# Patient Record
Sex: Female | Born: 1935 | Race: Black or African American | Hispanic: No | Smoking: Never smoker
Health system: Southern US, Community
[De-identification: ages and names within clinical notes are randomized; demographics above are authoritative.]

## PROBLEM LIST (undated history)

## (undated) DIAGNOSIS — G40909 Epilepsy, unspecified, not intractable, without status epilepticus: Secondary | ICD-10-CM

## (undated) DIAGNOSIS — E785 Hyperlipidemia, unspecified: Secondary | ICD-10-CM

## (undated) DIAGNOSIS — I10 Essential (primary) hypertension: Secondary | ICD-10-CM

## (undated) DIAGNOSIS — I639 Cerebral infarction, unspecified: Secondary | ICD-10-CM

## (undated) DIAGNOSIS — C50919 Malignant neoplasm of unspecified site of unspecified female breast: Secondary | ICD-10-CM

## (undated) HISTORY — PX: OTHER SURGICAL HISTORY: SHX169

---

## 2004-04-30 ENCOUNTER — Ambulatory Visit: Payer: Self-pay | Admitting: Internal Medicine

## 2004-05-15 ENCOUNTER — Ambulatory Visit: Payer: Self-pay | Admitting: Internal Medicine

## 2004-11-13 ENCOUNTER — Ambulatory Visit: Payer: Self-pay | Admitting: Internal Medicine

## 2005-05-09 ENCOUNTER — Ambulatory Visit: Payer: Self-pay | Admitting: Internal Medicine

## 2005-11-20 ENCOUNTER — Ambulatory Visit: Payer: Self-pay | Admitting: Internal Medicine

## 2005-12-26 ENCOUNTER — Ambulatory Visit: Payer: Self-pay | Admitting: Internal Medicine

## 2006-06-24 ENCOUNTER — Ambulatory Visit: Payer: Self-pay | Admitting: Internal Medicine

## 2006-07-01 ENCOUNTER — Ambulatory Visit: Payer: Self-pay | Admitting: Internal Medicine

## 2006-07-14 ENCOUNTER — Ambulatory Visit: Payer: Self-pay | Admitting: Internal Medicine

## 2006-11-12 ENCOUNTER — Ambulatory Visit: Payer: Self-pay | Admitting: Internal Medicine

## 2006-12-22 ENCOUNTER — Ambulatory Visit: Payer: Self-pay | Admitting: Internal Medicine

## 2006-12-23 ENCOUNTER — Ambulatory Visit: Payer: Self-pay | Admitting: Internal Medicine

## 2007-01-13 ENCOUNTER — Ambulatory Visit: Payer: Self-pay | Admitting: Internal Medicine

## 2007-01-25 ENCOUNTER — Ambulatory Visit: Payer: Self-pay | Admitting: Internal Medicine

## 2007-05-16 ENCOUNTER — Ambulatory Visit: Payer: Self-pay | Admitting: Internal Medicine

## 2007-06-09 ENCOUNTER — Ambulatory Visit: Payer: Self-pay | Admitting: Internal Medicine

## 2007-06-13 ENCOUNTER — Ambulatory Visit: Payer: Self-pay | Admitting: Internal Medicine

## 2007-06-30 ENCOUNTER — Ambulatory Visit: Payer: Self-pay | Admitting: Internal Medicine

## 2007-07-14 ENCOUNTER — Ambulatory Visit: Payer: Self-pay | Admitting: Internal Medicine

## 2007-08-13 ENCOUNTER — Ambulatory Visit: Payer: Self-pay | Admitting: Internal Medicine

## 2007-09-13 ENCOUNTER — Ambulatory Visit: Payer: Self-pay | Admitting: Internal Medicine

## 2007-11-13 ENCOUNTER — Ambulatory Visit: Payer: Self-pay | Admitting: Internal Medicine

## 2007-11-24 ENCOUNTER — Ambulatory Visit: Payer: Self-pay | Admitting: Internal Medicine

## 2007-12-14 ENCOUNTER — Ambulatory Visit: Payer: Self-pay | Admitting: Internal Medicine

## 2008-01-13 ENCOUNTER — Ambulatory Visit: Payer: Self-pay | Admitting: Internal Medicine

## 2008-02-13 ENCOUNTER — Ambulatory Visit: Payer: Self-pay | Admitting: Internal Medicine

## 2008-05-15 ENCOUNTER — Ambulatory Visit: Payer: Self-pay | Admitting: Internal Medicine

## 2008-06-12 ENCOUNTER — Ambulatory Visit: Payer: Self-pay | Admitting: Internal Medicine

## 2008-08-15 ENCOUNTER — Ambulatory Visit: Payer: Self-pay | Admitting: Internal Medicine

## 2008-08-31 ENCOUNTER — Ambulatory Visit: Payer: Self-pay | Admitting: Surgery

## 2008-09-12 ENCOUNTER — Ambulatory Visit: Payer: Self-pay | Admitting: Internal Medicine

## 2008-10-11 ENCOUNTER — Ambulatory Visit: Payer: Self-pay | Admitting: Internal Medicine

## 2008-10-12 ENCOUNTER — Ambulatory Visit: Payer: Self-pay | Admitting: Internal Medicine

## 2008-11-12 ENCOUNTER — Ambulatory Visit: Payer: Self-pay | Admitting: Internal Medicine

## 2008-11-15 ENCOUNTER — Ambulatory Visit: Payer: Self-pay | Admitting: Internal Medicine

## 2008-12-13 ENCOUNTER — Ambulatory Visit: Payer: Self-pay | Admitting: Internal Medicine

## 2009-01-12 ENCOUNTER — Ambulatory Visit: Payer: Self-pay | Admitting: Internal Medicine

## 2009-02-07 ENCOUNTER — Ambulatory Visit: Payer: Self-pay | Admitting: Internal Medicine

## 2009-02-12 ENCOUNTER — Ambulatory Visit: Payer: Self-pay | Admitting: Internal Medicine

## 2009-02-20 ENCOUNTER — Ambulatory Visit: Payer: Self-pay | Admitting: Family Medicine

## 2009-03-07 ENCOUNTER — Ambulatory Visit: Payer: Self-pay | Admitting: Family Medicine

## 2009-03-14 ENCOUNTER — Ambulatory Visit: Payer: Self-pay | Admitting: Internal Medicine

## 2009-05-15 ENCOUNTER — Ambulatory Visit: Payer: Self-pay | Admitting: Internal Medicine

## 2009-05-30 ENCOUNTER — Ambulatory Visit: Payer: Self-pay | Admitting: Internal Medicine

## 2009-06-12 ENCOUNTER — Ambulatory Visit: Payer: Self-pay | Admitting: Internal Medicine

## 2009-08-12 ENCOUNTER — Ambulatory Visit: Payer: Self-pay | Admitting: Internal Medicine

## 2009-08-30 ENCOUNTER — Inpatient Hospital Stay: Payer: Self-pay | Admitting: Internal Medicine

## 2009-09-12 ENCOUNTER — Ambulatory Visit: Payer: Self-pay | Admitting: Internal Medicine

## 2010-07-09 ENCOUNTER — Emergency Department: Payer: Self-pay | Admitting: Emergency Medicine

## 2010-08-14 ENCOUNTER — Inpatient Hospital Stay: Payer: Self-pay | Admitting: Internal Medicine

## 2010-09-13 ENCOUNTER — Ambulatory Visit: Payer: Self-pay | Admitting: Internal Medicine

## 2010-09-19 ENCOUNTER — Inpatient Hospital Stay: Payer: Self-pay | Admitting: *Deleted

## 2010-10-13 ENCOUNTER — Ambulatory Visit: Payer: Self-pay | Admitting: Internal Medicine

## 2010-11-06 ENCOUNTER — Inpatient Hospital Stay: Payer: Self-pay | Admitting: Internal Medicine

## 2010-11-12 ENCOUNTER — Ambulatory Visit: Payer: Self-pay

## 2011-06-30 IMAGING — CT CT HEAD WITHOUT CONTRAST
2 series · 15 of 30 positions shown, 19 images · non-contrast
Comparison: none

REASON FOR EXAM: right sided weakness
COMMENTS:   May transport without cardiac monitor

PROCEDURE:     CT  - CT HEAD WITHOUT CONTRAST  - August 30, 2009  [DATE]
RESULT:     Comparison:  None
TECHNIQUE: Multiple axial images from the foramen magnum to the vertex were
obtained without IV contrast.

[Series 2: without · axial · non-contrast · 0.39mm/px · z∈[+310,+430]mm · 13 of 29 slices shown, 17 images]
[im 3/29  brain]
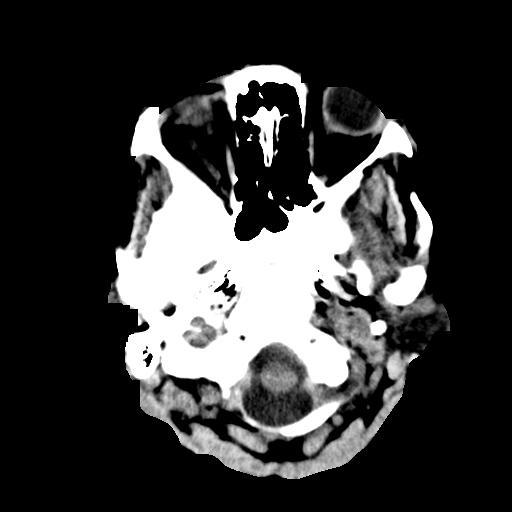
[im 3/29  bone]
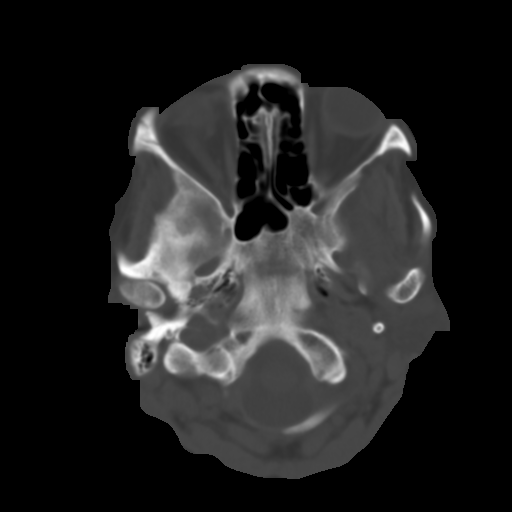
[im 5/29  brain]
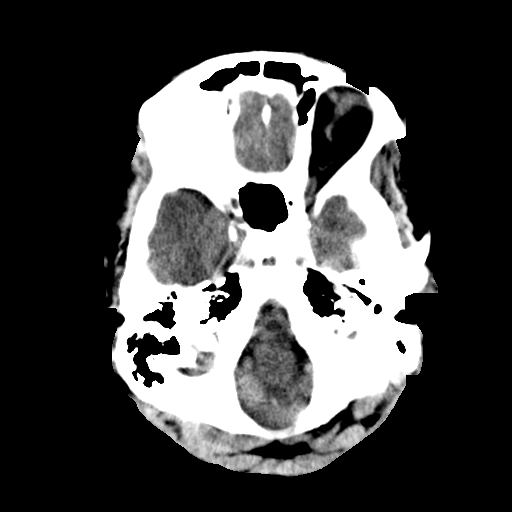
[im 7/29  brain]
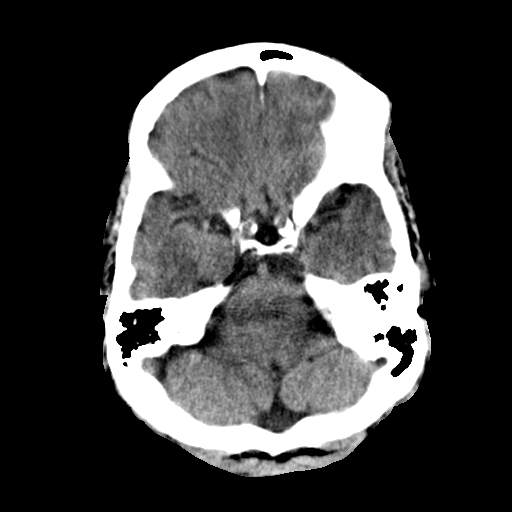
[im 9/29  brain]
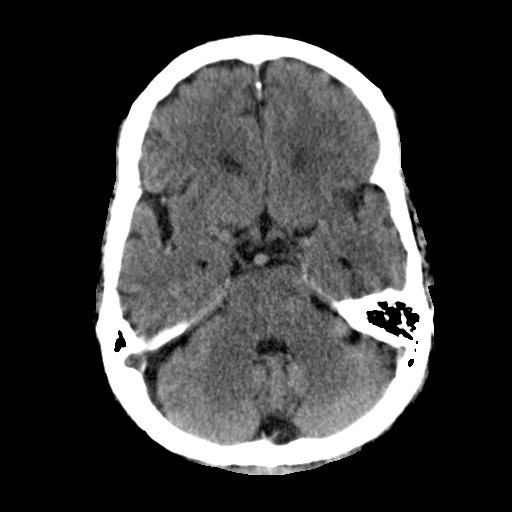
[im 11/29  brain]
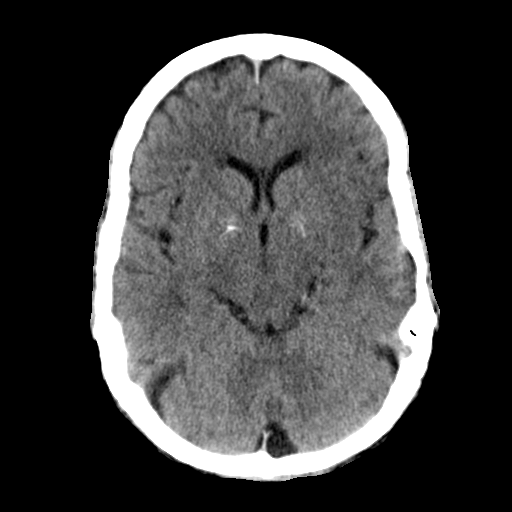
[im 11/29  bone]
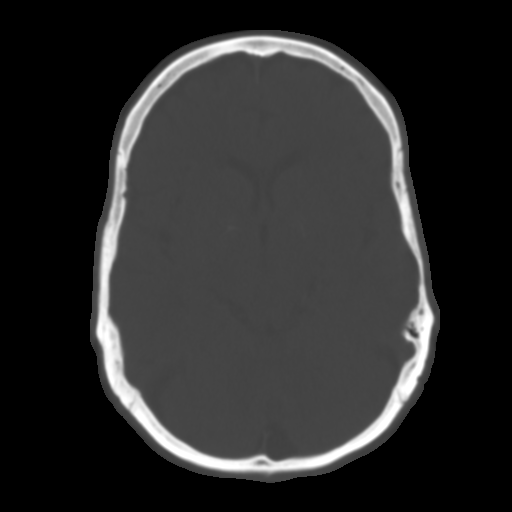
[im 13/29  brain]
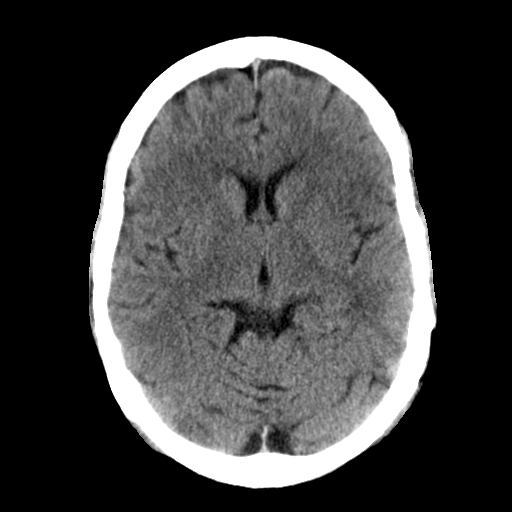
[im 15/29  brain]
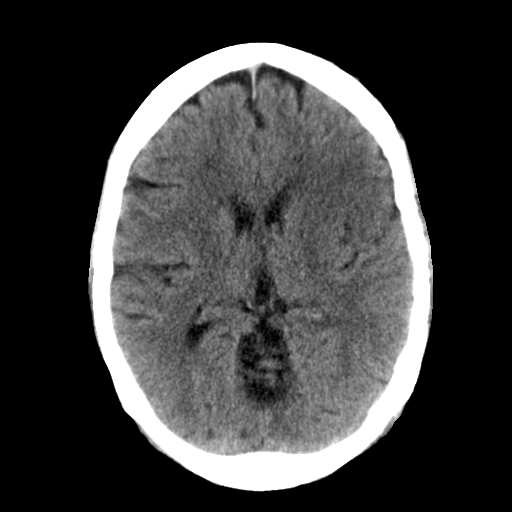
[im 17/29  brain]
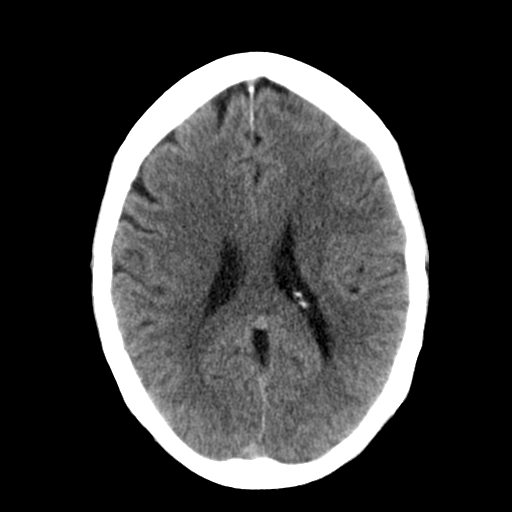
[im 19/29  brain]
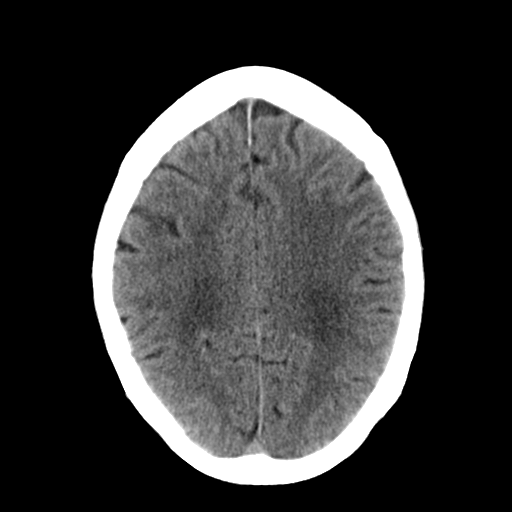
[im 19/29  bone]
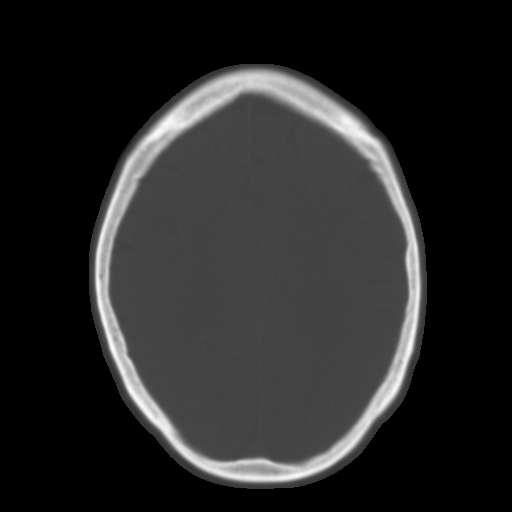
[im 21/29  brain]
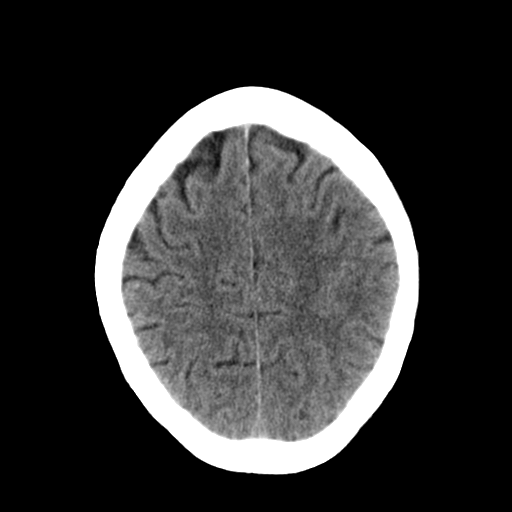
[im 23/29  brain]
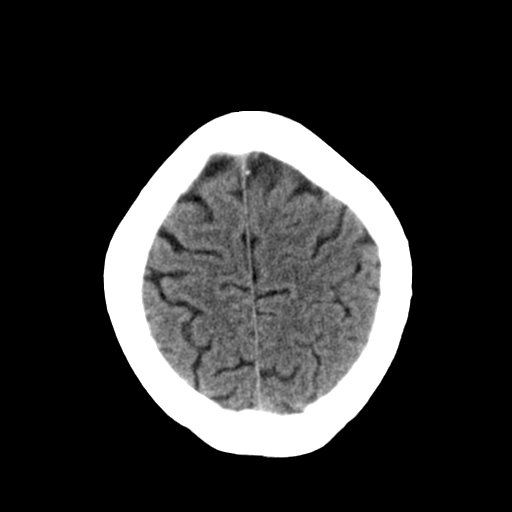
[im 25/29  brain]
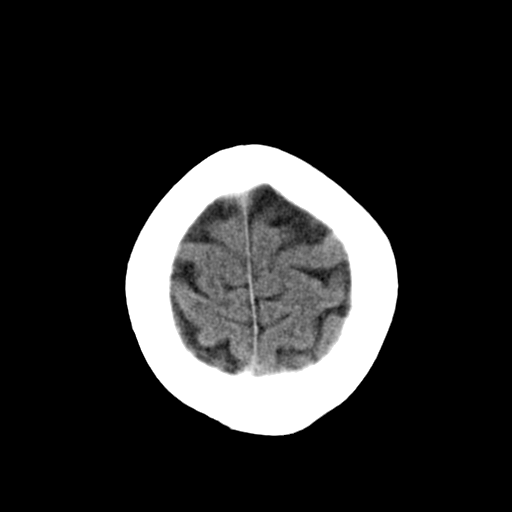
[im 27/29  brain]
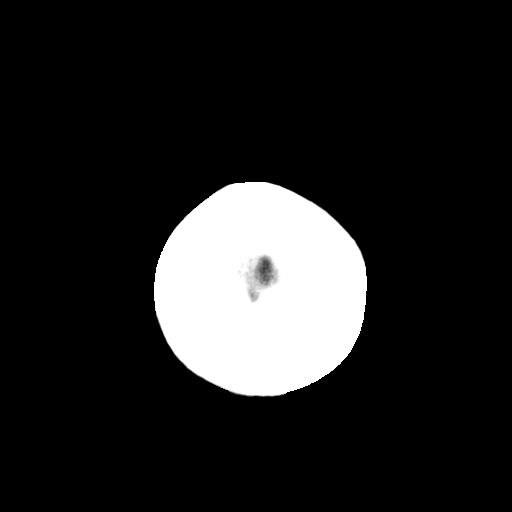
[im 27/29  bone]
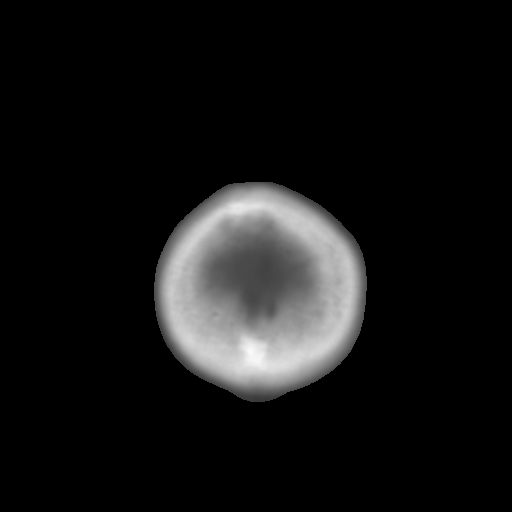

[Series 3: bone · axial · 0.39mm/px · z∈[+310,+330]mm · 2 of 29 slices shown]
[im 3/29  bone]
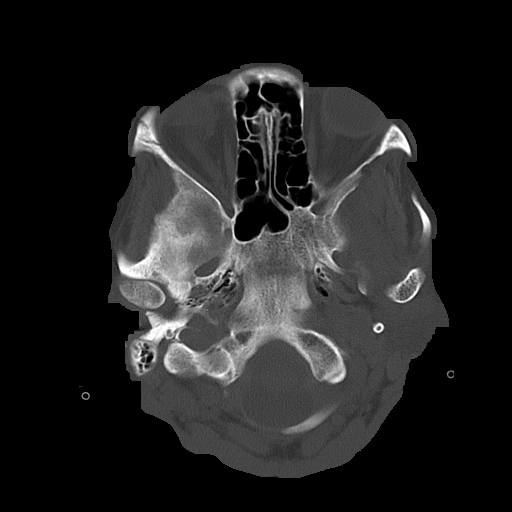
[im 7/29  bone]
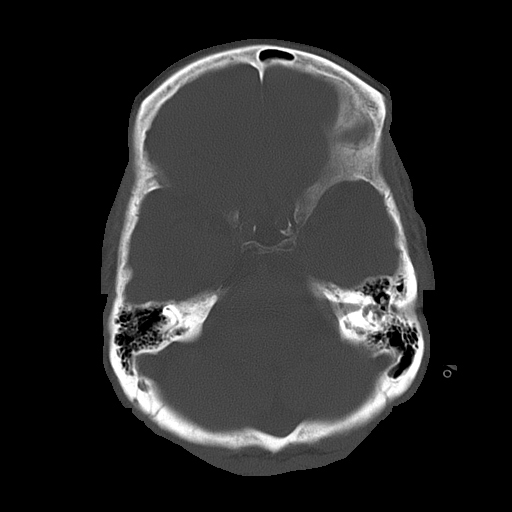

[15 of 30 positions shown; findings below may reference images not displayed]

FINDINGS: There is no evidence of mass effect, midline shift, or extra-axial fluid
collections.  There is no evidence of a space-occupying lesion or
intracranial hemorrhage. There is left frontal lobe low-attenuation with
effacement of the gray-white differentiation concerning for an acute
infarct. There is generalized cerebral atrophy. There is periventricular
white matter low attenuation likely secondary to microangiopathy.

The ventricles and sulci are appropriate for the patient's age. The basal
cisterns are patent.

Visualized portions of the orbits are unremarkable. The visualized portions
of the paranasal sinuses and mastoid air cells are unremarkable.
Cerebrovascular atherosclerotic calcifications are noted.

The osseous structures are unremarkable.
IMPRESSION: There is left frontal lobe low-attenuation with effacement of the gray-white
differentiation concerning for a nonhemorrhagic acute infarct.

## 2011-06-30 IMAGING — US US CAROTID DUPLEX BILAT
1 series · 17 of 24 positions shown · non-contrast
Comparison: none

REASON FOR EXAM: cva
COMMENTS:

[Series 1: us carotid duplex bilat · 17 of 54 slices shown]
[im 1/54]
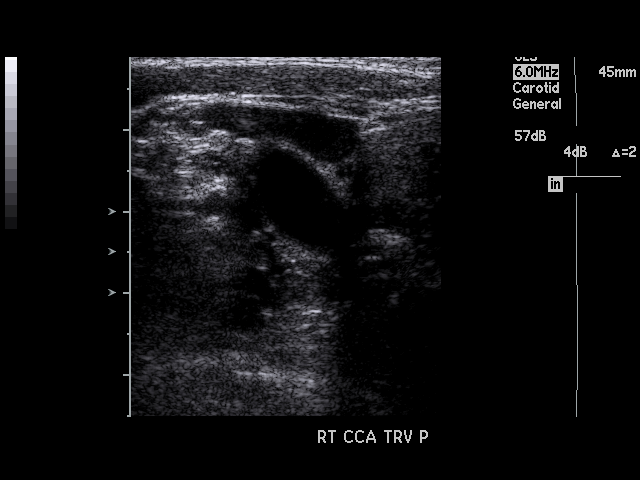
[im 5/54]
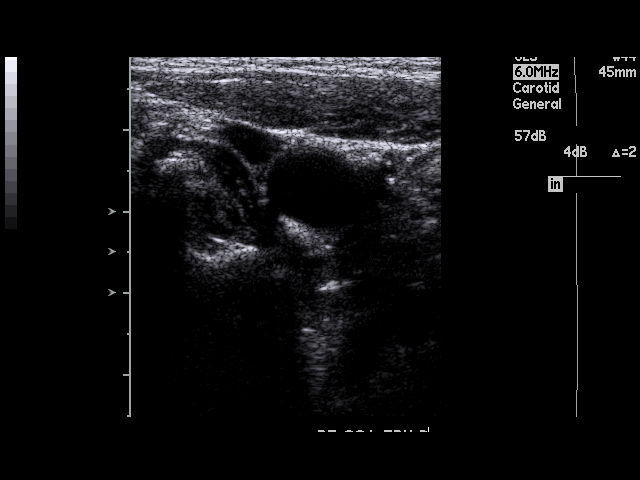
[im 7/54]
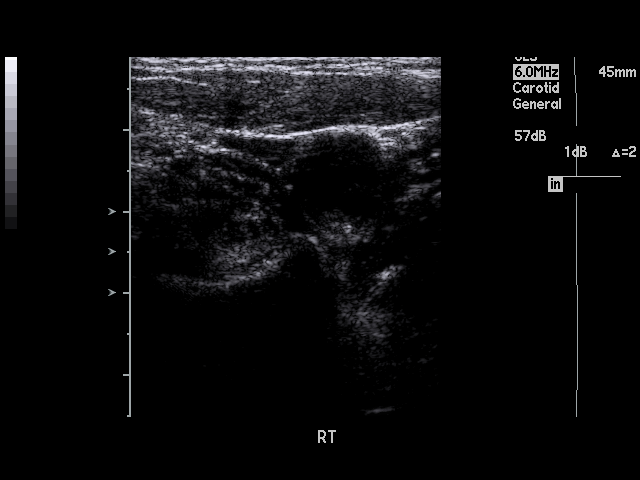
[im 10/54]
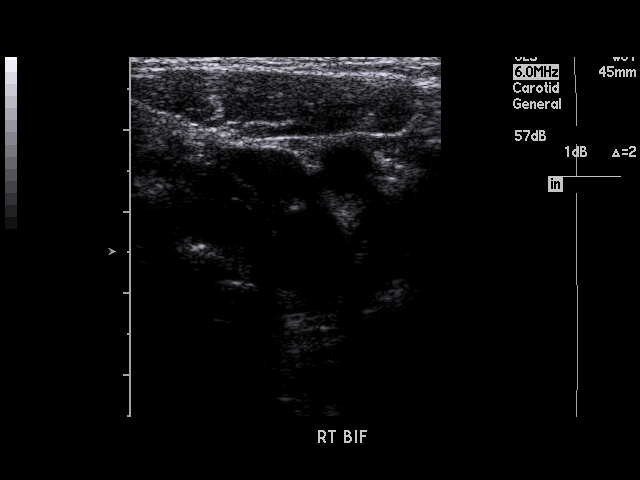
[im 14/54]
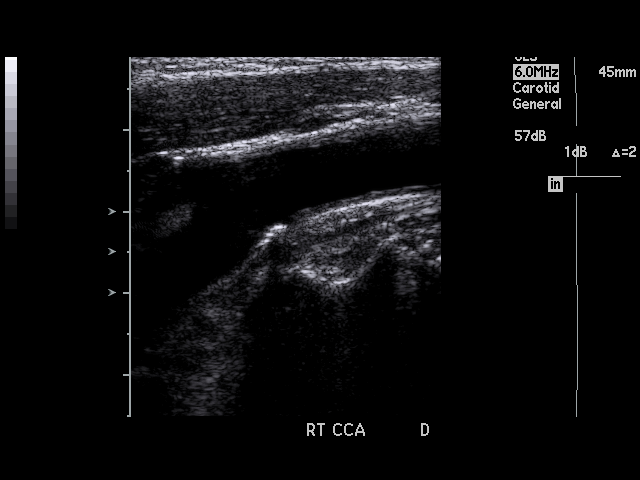
[im 17/54]
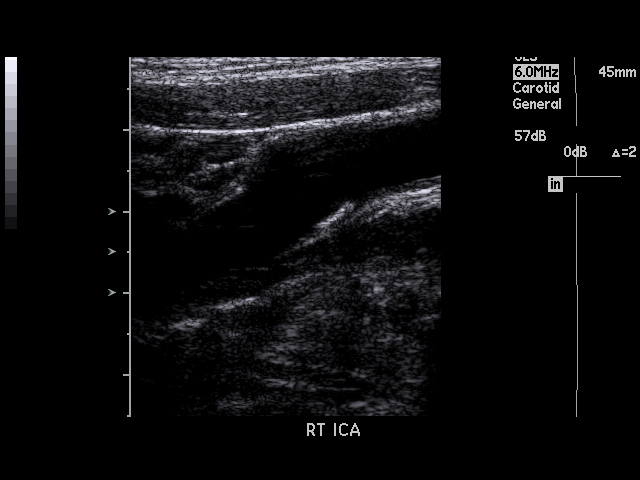
[im 21/54]
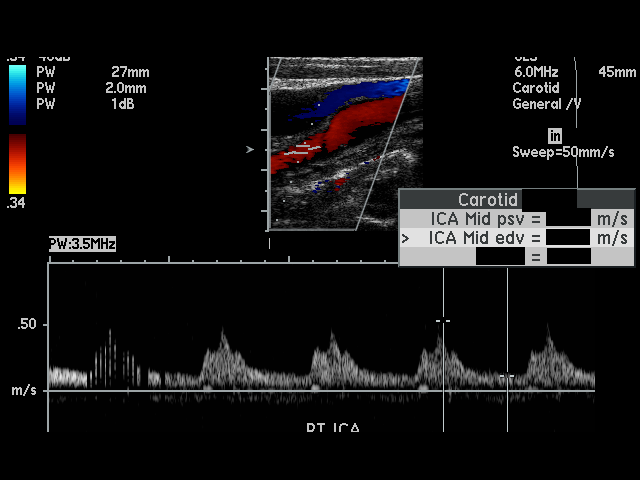
[im 24/54]
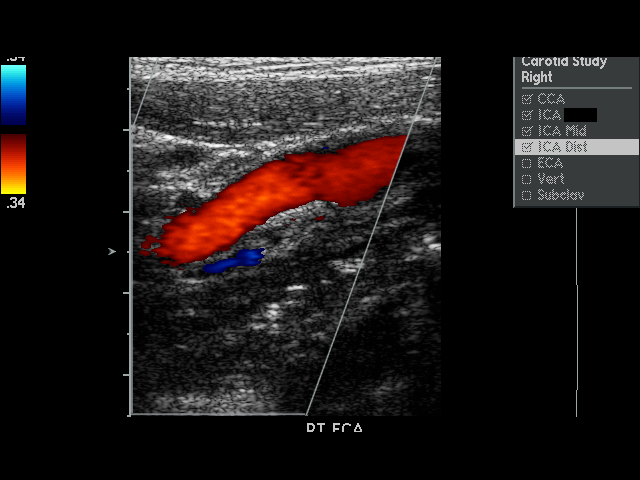
[im 28/54]
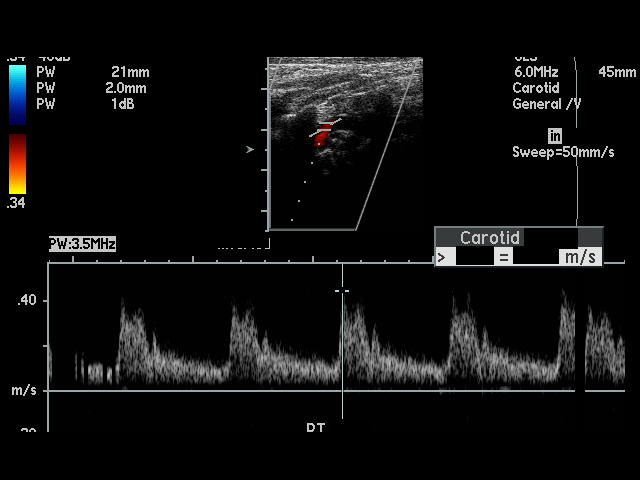
[im 30/54]
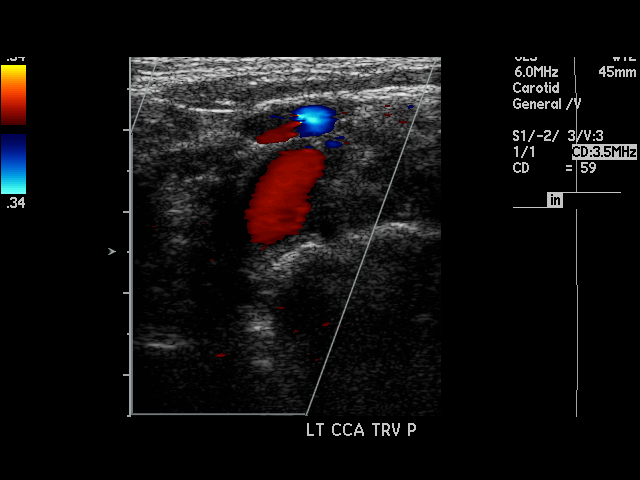
[im 33/54]
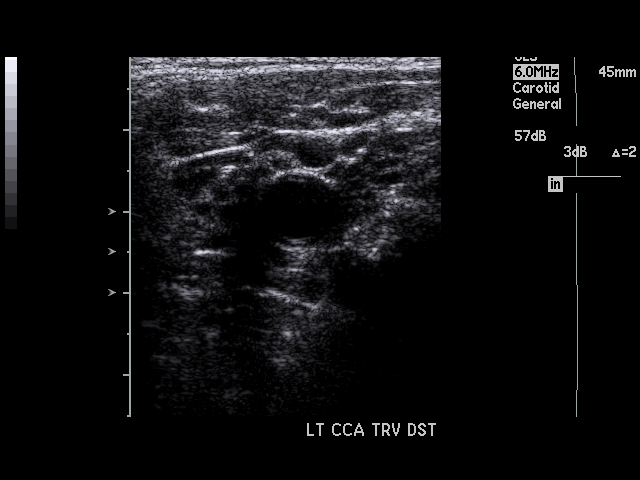
[im 37/54]
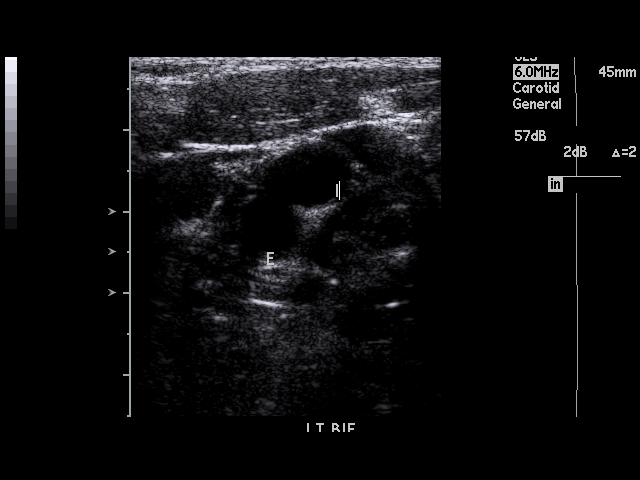
[im 40/54]
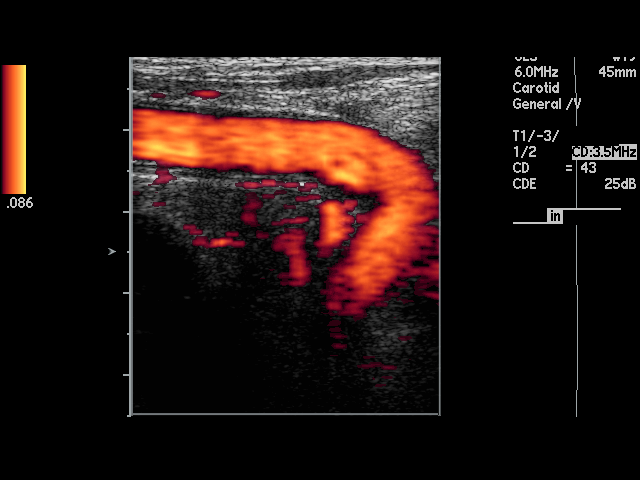
[im 44/54]
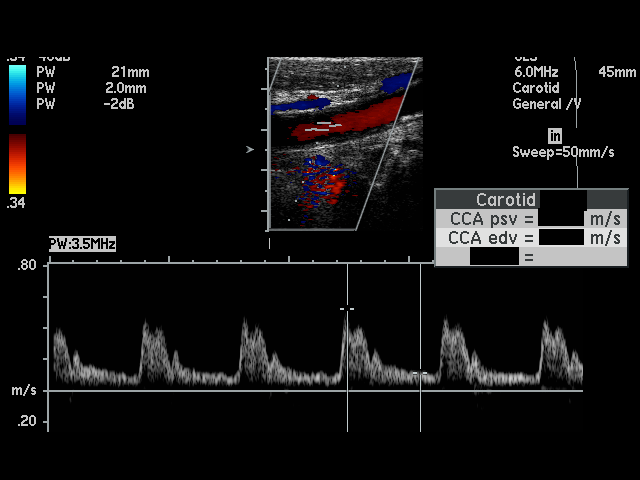
[im 47/54]
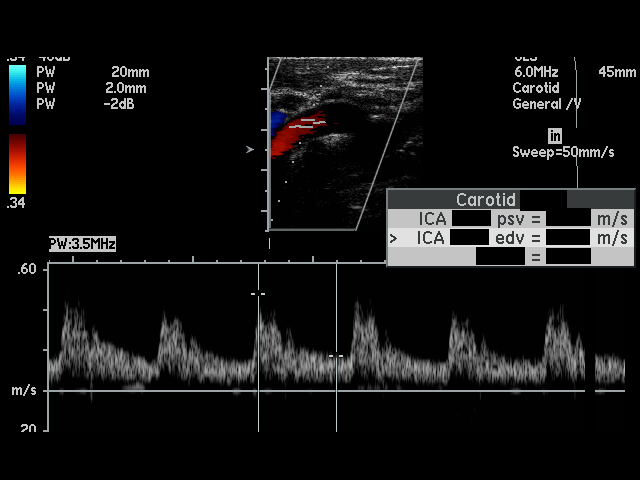
[im 49/54]
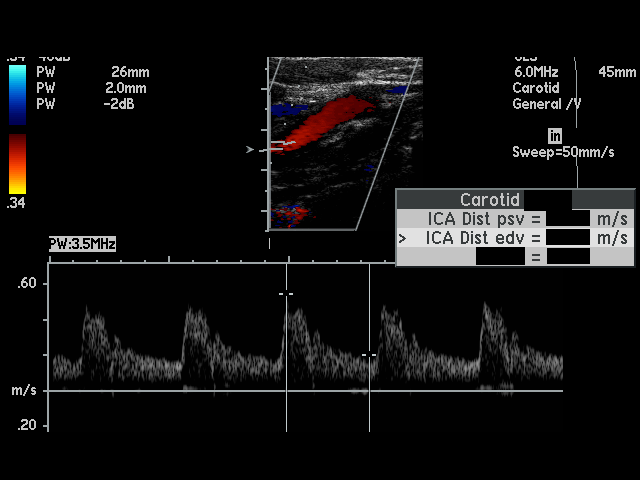
[im 54/54]
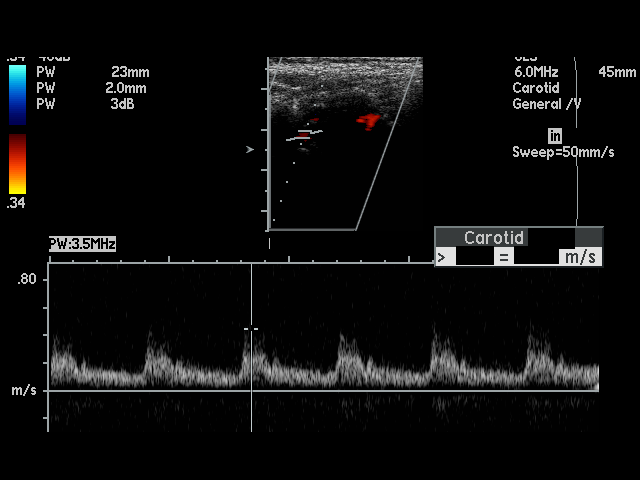

[17 of 24 positions shown; findings below may reference images not displayed]

PROCEDURE:     US  - US CAROTID DOPPLER BILATERAL  - August 30, 2009  [DATE]

RESULT:     There is noted a small amount of calcific plaque formation about
the carotid bifurcations bilaterally. On the right, the peak right common
carotid flow velocity measures 0.527 meters/second and the peak right
internal carotid artery flow velocity measures 0.535 meters/second.  ICA/CCA
ratio is 1.015.

On the left, the peak left common carotid artery flow velocity measures
0.529 meters/second and the left internal carotid artery flow velocity
measures 0.548 meters/second. ICA/CCA ratio is 1.036. These values
bilaterally are within the normal range and are consistent with bilateral
absence of hemodynamically significant stenosis.

There is observed antegrade flow in both vertebrals.
IMPRESSION: 1. There is amount of calcific plaque formation noted about the carotid
bifurcations bilaterally.
2. No hemodynamically significant stenosis is seen on either side.
3. There is antegrade flow in both vertebrals.

## 2012-09-22 ENCOUNTER — Emergency Department: Payer: Self-pay | Admitting: Emergency Medicine

## 2012-10-07 ENCOUNTER — Emergency Department: Payer: Self-pay | Admitting: Emergency Medicine

## 2014-08-04 NOTE — Consult Note (Signed)
PATIENT NAME:  Kirsten Beck, Kirsten Beck MR#:  [id number] DATE OF BIRTH:  27-Oct-1928  DATE OF CONSULTATION:  09/22/2012  CONSULTING PHYSICIAN:  Huey Romans, MD  REASON FOR CONSULTATION: Acute angioedema.   HISTORY OF PRESENT ILLNESS: The patient is an 81 year old black female who has had intermittent tongue swelling over the last couple of weeks. She has been on multiple medications for blood pressure, including lisinopril. This evening, she had marked swelling of her tongue.  It swelled so large she is unable to talk.  She is not acutely short of breath.  She came to the Emergency Room about an hour ago. She had an IV started, given IV medications, and she actually feels like her tongue swelling is going down and feels a little bit better right now. The intermittent swelling she has had was never severe, and it went away on its own. She does not have a whole lot of other allergy problems.   PAST MEDICAL HISTORY: Significant for blood pressure. She is followed by Dr. Shade Flood for her medical problems.  She has coronary artery disease and type 2 diabetes as well. She has had a previous CVA, a history of some seizures and depression. She has also had breast cancer with left mastectomy for control of that.   REVIEW OF SYSTEMS: She did not complain of any other medical challenges currently.   SOCIAL HISTORY: She has not been a smoker. She does not drink alcohol nor been on any other drugs.   FAMILY HISTORY: Negative for any allergies or angioedema.   PHYSICAL EXAMINATION:  GENERAL:  The patient is awake and alert.  HEENT: She is unable to talk much but can make some sounds in the larynx fine but is dysarthric. Right floor of mouth and right side of tongue is a little more swollen than the left. There is no skin rash, redness or inflammation. The nose is open and clear. The posterior pharynx I cannot see well because of the tongue size.  NECK: Negative for any nodes or masses. No swelling in her submental  or anterior neck.  LUNGS: Clear.  HEART:  Regular rate and rhythm without murmur.  ABDOMEN: Benign.  NEUROLOGICAL:  She is alert and oriented and understands what I am doing.   PROCEDURE: Flexible laryngoscopy. This is dictated in detail elsewhere. This shows a healthy nose, nasopharynx. The epiglottis looks normal. There was just the slightest bit of watery edema in the right vallecula, but the rest of the tongue base is not swollen. The vocal cords are pearly white and move well. The subglottic space is clear. No swelling or any mucus collected here in the throat.   IMPRESSION: The patient has had acute angioedema that already is starting to turn around, and she is feeling better since the medication started. She does not have any swelling around her larynx and should have no problems with laryngeal edema. Once the swelling starts to subside, it will continue to resolve.  She needs to continue on the Benadryl every 4 hours until the swelling is gone. If she is watched a couple more hours and the swelling is really settling down, then she does not need to stay overnight but just get Benadryl every 4 hours.  Only if she continues to have swelling problems and feels like it is not improved at all, then she can be watched longer.  The swelling should all be gone by morning time and normal again.  It appears as though lisinopril is the most  likely medication she is allergic to.  We have told her not to take any more and have circled it on her med list.  She knows not to use that one.  She will call Dr. Shade Flood in the morning to set up an appointment to see him either later this week or next week to evaluate her blood pressure and see what would be an appropriate medication to give her instead of lisinopril. She does not need ENT followup unless she has further problems with her throat or swelling.   ____________________________ Huey Romans, MD phj:cb D: 09/22/2012 20:04:06 ET T: 09/22/2012 20:26:37  ET JOB#: [id number]  cc: Huey Romans, MD, <Dictator> Huey Romans MD ELECTRONICALLY SIGNED 09/29/2012 12:44

## 2014-08-04 NOTE — Op Note (Signed)
PATIENT NAME:  Kirsten Beck, Kirsten Beck MR#:  [id number] DATE OF BIRTH:  11-Dec-1928  DATE OF PROCEDURE:  09/22/2012  PREOPERATIVE DIAGNOSIS:  Angioedema with possible laryngeal swelling.  POSTOPERATIVE DIAGNOSIS:  Angioedema with a normal larynx.   OPERATIVE PROCEDURE:  Flexible laryngoscopy.   ANESTHESIA:  None.  SURGEON:  Huey Romans, M.D.   DESCRIPTION OF PROCEDURE:  The patient was seen in the Emergency Room because of acute angioedema that flared up today.  He has enough floor, mouth and tongue swelling that she is unable to articulate very well at all.  She is not feeling short of breath.   The flexible scope is lubricated and placed through the left nostril for visualization of the nasopharynx.  The hypopharynx, the nose and nasopharynx are clear.  Hypopharynx shows just a little bit of watery edema in the vallecula, but the base of tongue appears to be pretty normal.  The epiglottis is not swollen at all.  The laryngeal inlet shows pearly white vocal cords that move well.  No sign of swelling at the larynx.  Subglottic area is clear.  There is no sign of redness or lesions anywhere.  She can swallow on command.  She is handling her secretions well and has no sign of pooling of secretions in the throat.   The patient tolerated the procedure well.  This was done at the bedside.  There were no operative complications.     ____________________________ Huey Romans, MD phj:ea D: 09/22/2012 19:55:04 ET T: 09/22/2012 23:30:57 ET JOB#: [id number]  cc: Huey Romans, MD, <Dictator> Huey Romans MD ELECTRONICALLY SIGNED 09/29/2012 12:44

## 2016-10-06 ENCOUNTER — Encounter: Payer: Self-pay | Admitting: Internal Medicine

## 2016-10-06 ENCOUNTER — Observation Stay
Admission: EM | Admit: 2016-10-06 | Discharge: 2016-10-08 | Disposition: A | Discharge status: Home / Self Care | Payer: Medicare Other | Attending: Internal Medicine | Admitting: Internal Medicine

## 2016-10-06 ENCOUNTER — Emergency Department: Payer: Medicare Other

## 2016-10-06 DIAGNOSIS — I69354 Hemiplegia and hemiparesis following cerebral infarction affecting left non-dominant side: Secondary | ICD-10-CM | POA: Insufficient documentation

## 2016-10-06 DIAGNOSIS — N390 Urinary tract infection, site not specified: Secondary | ICD-10-CM | POA: Diagnosis not present

## 2016-10-06 DIAGNOSIS — I69320 Aphasia following cerebral infarction: Secondary | ICD-10-CM | POA: Diagnosis not present

## 2016-10-06 DIAGNOSIS — G40909 Epilepsy, unspecified, not intractable, without status epilepticus: Principal | ICD-10-CM | POA: Insufficient documentation

## 2016-10-06 DIAGNOSIS — E785 Hyperlipidemia, unspecified: Secondary | ICD-10-CM | POA: Diagnosis not present

## 2016-10-06 DIAGNOSIS — Z91010 Allergy to peanuts: Secondary | ICD-10-CM | POA: Diagnosis not present

## 2016-10-06 DIAGNOSIS — Z853 Personal history of malignant neoplasm of breast: Secondary | ICD-10-CM | POA: Insufficient documentation

## 2016-10-06 DIAGNOSIS — K5900 Constipation, unspecified: Secondary | ICD-10-CM | POA: Diagnosis not present

## 2016-10-06 DIAGNOSIS — I10 Essential (primary) hypertension: Secondary | ICD-10-CM | POA: Diagnosis not present

## 2016-10-06 DIAGNOSIS — R569 Unspecified convulsions: Secondary | ICD-10-CM

## 2016-10-06 HISTORY — DX: Essential (primary) hypertension: I10

## 2016-10-06 HISTORY — DX: Epilepsy, unspecified, not intractable, without status epilepticus: G40.909

## 2016-10-06 HISTORY — DX: Cerebral infarction, unspecified: I63.9

## 2016-10-06 HISTORY — DX: Hyperlipidemia, unspecified: E78.5

## 2016-10-06 HISTORY — DX: Malignant neoplasm of unspecified site of unspecified female breast: C50.919

## 2016-10-06 LAB — CBC WITH DIFFERENTIAL/PLATELET
BASOS ABS: 0.1 10*3/uL (ref 0–0.1)
Basophils Relative: 1 %
Eosinophils Absolute: 0.2 10*3/uL (ref 0–0.7)
Eosinophils Relative: 2 %
HEMATOCRIT: 36.7 % (ref 35.0–47.0)
HEMOGLOBIN: 12.3 g/dL (ref 12.0–16.0)
LYMPHS PCT: 20 %
Lymphs Abs: 1.7 10*3/uL (ref 1.0–3.6)
MCH: 31.1 pg (ref 26.0–34.0)
MCHC: 33.7 g/dL (ref 32.0–36.0)
MCV: 92.4 fL (ref 80.0–100.0)
Monocytes Absolute: 0.8 10*3/uL (ref 0.2–0.9)
Monocytes Relative: 9 %
NEUTROS PCT: 68 %
Neutro Abs: 6 10*3/uL (ref 1.4–6.5)
Platelets: 250 10*3/uL (ref 150–440)
RBC: 3.97 MIL/uL (ref 3.80–5.20)
RDW: 15.3 % — ABNORMAL HIGH (ref 11.5–14.5)
WBC: 8.8 10*3/uL (ref 3.6–11.0)

## 2016-10-06 LAB — URINALYSIS, ROUTINE W REFLEX MICROSCOPIC
Bilirubin Urine: NEGATIVE
GLUCOSE, UA: NEGATIVE mg/dL
Hgb urine dipstick: NEGATIVE
Ketones, ur: NEGATIVE mg/dL
Nitrite: NEGATIVE
PROTEIN: NEGATIVE mg/dL
Specific Gravity, Urine: 1.015 (ref 1.005–1.030)
pH: 6 (ref 5.0–8.0)

## 2016-10-06 LAB — COMPREHENSIVE METABOLIC PANEL
ALBUMIN: 4.2 g/dL (ref 3.5–5.0)
ALT: 15 U/L (ref 14–54)
AST: 24 U/L (ref 15–41)
Alkaline Phosphatase: 76 U/L (ref 38–126)
Anion gap: 8 (ref 5–15)
BILIRUBIN TOTAL: 0.9 mg/dL (ref 0.3–1.2)
BUN: 15 mg/dL (ref 6–20)
CO2: 23 mmol/L (ref 22–32)
Calcium: 9.2 mg/dL (ref 8.9–10.3)
Chloride: 108 mmol/L (ref 101–111)
Creatinine, Ser: 0.97 mg/dL (ref 0.44–1.00)
GFR calc Af Amer: >60 mL/min (ref >60)
GFR calc non Af Amer: 52 mL/min — ABNORMAL LOW (ref >60)
GLUCOSE: 139 mg/dL — AB (ref 65–99)
POTASSIUM: 3.7 mmol/L (ref 3.5–5.1)
Sodium: 139 mmol/L (ref 135–145)
TOTAL PROTEIN: 7.7 g/dL (ref 6.5–8.1)

## 2016-10-06 LAB — MAGNESIUM: Magnesium: 1.9 mg/dL (ref 1.7–2.4)

## 2016-10-06 MED ORDER — LEVETIRACETAM 500 MG PO TABS
500.0000 mg | ORAL_TABLET | Freq: Two times a day (BID) | ORAL | Status: DC
  Administered 2016-10-06: 500 mg via ORAL
  Filled 2016-10-06: qty 1

## 2016-10-06 MED ORDER — ACETAMINOPHEN 650 MG RE SUPP
650.0000 mg | Freq: Four times a day (QID) | RECTAL | Status: DC | PRN
  Filled 2016-10-06: qty 1

## 2016-10-06 MED ORDER — ACETAMINOPHEN 325 MG PO TABS
650.0000 mg | ORAL_TABLET | Freq: Four times a day (QID) | ORAL | Status: DC | PRN
  Filled 2016-10-06: qty 2

## 2016-10-06 MED ORDER — FLEET ENEMA 7-19 GM/118ML RE ENEM
1.0000 | ENEMA | Freq: Once | RECTAL | Status: AC
Start: 2016-10-06 — End: 2016-10-06
  Administered 2016-10-06: 1 via RECTAL

## 2016-10-06 MED ORDER — LEVETIRACETAM 250 MG PO TABS
250.0000 mg | ORAL_TABLET | ORAL | Status: AC
  Administered 2016-10-06: 250 mg via ORAL
  Filled 2016-10-06: qty 1

## 2016-10-06 MED ORDER — ENOXAPARIN SODIUM 40 MG/0.4ML ~~LOC~~ SOLN
40.0000 mg | SUBCUTANEOUS | Status: DC
  Administered 2016-10-06 – 2016-10-07 (×2): 40 mg via SUBCUTANEOUS
  Filled 2016-10-06 (×3): qty 0.4

## 2016-10-06 MED ORDER — LEVETIRACETAM 750 MG PO TABS
750.0000 mg | ORAL_TABLET | Freq: Two times a day (BID) | ORAL | Status: DC
  Administered 2016-10-06 – 2016-10-08 (×4): 750 mg via ORAL
  Filled 2016-10-06 (×5): qty 1

## 2016-10-06 MED ORDER — ONDANSETRON HCL 4 MG/2ML IJ SOLN
4.0000 mg | Freq: Four times a day (QID) | INTRAMUSCULAR | Status: DC | PRN
  Filled 2016-10-06: qty 2

## 2016-10-06 MED ORDER — DEXTROSE 5 % IV SOLN
1.0000 g | Freq: Once | INTRAVENOUS | Status: AC
  Administered 2016-10-06: 1 g via INTRAVENOUS
  Filled 2016-10-06: qty 10

## 2016-10-06 MED ORDER — LEVETIRACETAM 500 MG PO TABS
500.0000 mg | ORAL_TABLET | Freq: Once | ORAL | Status: AC
  Administered 2016-10-06: 500 mg via ORAL
  Filled 2016-10-06: qty 1

## 2016-10-06 MED ORDER — ONDANSETRON HCL 4 MG PO TABS
4.0000 mg | ORAL_TABLET | Freq: Four times a day (QID) | ORAL | Status: DC | PRN
  Filled 2016-10-06: qty 1

## 2016-10-06 MED ORDER — SENNOSIDES-DOCUSATE SODIUM 8.6-50 MG PO TABS
1.0000 | ORAL_TABLET | Freq: Every evening | ORAL | Status: DC | PRN
  Filled 2016-10-06: qty 1

## 2016-10-06 MED ORDER — ISOSORBIDE MONONITRATE ER 30 MG PO TB24
30.0000 mg | ORAL_TABLET | Freq: Every day | ORAL | Status: DC
  Administered 2016-10-06 – 2016-10-08 (×3): 30 mg via ORAL
  Filled 2016-10-06 (×3): qty 1

## 2016-10-06 MED ORDER — ATORVASTATIN CALCIUM 20 MG PO TABS
40.0000 mg | ORAL_TABLET | Freq: Every day | ORAL | Status: DC
  Administered 2016-10-06 – 2016-10-07 (×2): 40 mg via ORAL
  Filled 2016-10-06 (×3): qty 2

## 2016-10-06 MED ORDER — SODIUM CHLORIDE 0.9 % IV SOLN
INTRAVENOUS | Status: DC
  Administered 2016-10-06 – 2016-10-07 (×2): via INTRAVENOUS

## 2016-10-06 MED ORDER — LORAZEPAM 2 MG/ML IJ SOLN: 1.0000 mg | INTRAMUSCULAR | Status: DC | PRN

## 2016-10-06 MED ORDER — DOCUSATE SODIUM 100 MG PO CAPS
100.0000 mg | ORAL_CAPSULE | Freq: Two times a day (BID) | ORAL | Status: DC
  Administered 2016-10-07 – 2016-10-08 (×3): 100 mg via ORAL
  Filled 2016-10-06 (×4): qty 1

## 2016-10-06 MED ORDER — HYDRALAZINE HCL 10 MG PO TABS
10.0000 mg | ORAL_TABLET | Freq: Three times a day (TID) | ORAL | Status: DC
  Administered 2016-10-06 – 2016-10-08 (×6): 10 mg via ORAL
  Filled 2016-10-06 (×9): qty 1

## 2016-10-06 MED ORDER — CARVEDILOL 6.25 MG PO TABS
3.1250 mg | ORAL_TABLET | Freq: Two times a day (BID) | ORAL | Status: DC
  Administered 2016-10-06 – 2016-10-08 (×5): 3.125 mg via ORAL
  Filled 2016-10-06: qty 0.5
  Filled 2016-10-06 (×5): qty 1

## 2016-10-06 NOTE — ED Triage Notes (Signed)
Pt brought to the ER by daughter who returned home and found her mother having a seizure, as she was bringing her to the ER she began to have another seizure in the car. Pt was assisted from car while having the seizure. Daughter reports hx of seizures and cva, pt is minimally verbal from past cva

## 2016-10-06 NOTE — ED Notes (Signed)
Transport patient to room 123 from ED. AS

## 2016-10-06 NOTE — ED Provider Notes (Signed)
[facility] Emergency Department Provider Note   ____________________________________________   First MD Initiated Contact with Patient 10/06/16 0016     (approximate)  I have reviewed the triage vital signs and the nursing notes.   HISTORY  Chief Complaint Seizures    HPI Kirsten Beck is a 83 y.o. female history of a previous stroke that left her with weakness on the right side. She also has a history of seizures  Patient had a witnessed general shaking seizure at about 9:30 this evening. Family then put her in the car to drive her to the emergency room, on arrival to the emergency room she had another seizure. Again described as her going unconscious and shaking uncontrollably. It lasted only a couple minutes.  She is now awake and alert. The patient reports that she does not recall these events. She is in no pain. She reports taking her medications. Denies any recent illness. No injuries. No fevers or chills. No nausea or vomiting or chest pain. She reports she feels well, and is always weak on the right side. She does have some difficulty speaking due to her old stroke, but no new changes today.  No past medical history on file. Stroke Seizures Hypertension   There are no active problems to display for this patient.   No past surgical history on file.  Prior to Admission medications   Medication Sig Start Date End Date Taking? Authorizing Provider  atorvastatin (LIPITOR) 40 MG tablet Take 1 tablet by mouth daily. 10/01/16  Yes [provider]  carvedilol (COREG) 3.125 MG tablet Take 1 tablet by mouth 2 (two) times daily. 10/01/16  Yes [provider]  furosemide (LASIX) 40 MG tablet Take 1 tablet by mouth daily. 10/01/16  Yes [provider]  hydrALAZINE (APRESOLINE) 10 MG tablet Take 1 tablet by mouth 3 (three) times daily. 10/01/16  Yes [provider]  isosorbide mononitrate (IMDUR) 30 MG 24 hr tablet Take  1 tablet by mouth daily. 10/01/16  Yes [provider]  levETIRAcetam (KEPPRA) 500 MG tablet Take 1 tablet by mouth 2 (two) times daily. 10/01/16  Yes [provider]  potassium chloride (K-DUR,KLOR-CON) 10 MEQ tablet Take 1 tablet by mouth daily. 10/01/16  Yes [provider]    Allergies Peanut butter flavor  No family history on file.  Social History Social History  Substance Use Topics  . Smoking status: Not on file  . Smokeless tobacco: Not on file  . Alcohol use Not on file  Does not smoke, does not drink, does not use drugs   Review of Systems Constitutional: No fever/chills Eyes: No visual changes. ENT: No sore throat. Cardiovascular: Denies chest pain. Respiratory: Denies shortness of breath. Gastrointestinal: No abdominal pain.  No nausea, no vomiting.  No diarrhea.  No constipation. Genitourinary: Negative for dysuria. Musculoskeletal: Negative for back pain. Skin: Negative for rash. Neurological: Negative for headaches or numbness. See history of present illness regarding seizure. Chronic weakness on the right    ____________________________________________   PHYSICAL EXAM:  VITAL SIGNS: ED Triage Vitals  Enc Vitals Group     BP 10/06/16 0038 136/81     Pulse --      Resp 10/06/16 0038 18     Temp 10/06/16 0038 98.5 F (36.9 C)     Temp Source 10/06/16 0038 Oral     SpO2 10/06/16 0038 98 %     Weight 10/06/16 0040 145 lb (65.8 kg)  Height --      Head Circumference --      Peak Flow --      Pain Score --      Pain Loc --      Pain Edu? --      Excl. in [place]? --     Constitutional: Alert and oriented. Well appearing and in no acute distress. Eyes: Conjunctivae are normal. Head: Atraumatic. Nose: No congestion/rhinnorhea.Rather thick in voice, occasionally some dysarthria. Family and patient reports this is normal for her after her previous stroke. Mouth/Throat: Mucous membranes are moist. Neck: No stridor.     Cardiovascular: Normal rate, regular rhythm. Grossly normal heart sounds.  Good peripheral circulation. Respiratory: Normal respiratory effort.  No retractions. Lungs CTAB. Gastrointestinal: Soft and nontender. No distention. Musculoskeletal: No lower extremity tenderness nor edema. Good strength in the left upper and left lower extremity. No deficits noted. The right arm is slightly contracted and demonstrates only minimal strength. The right leg as similarly very weak, and demonstrates only minimal strength against gravity. Patient and family reports that these are her normal. Neurologic:  Normal speech and language. No gross focal neurologic deficits are appreciated.  Skin:  Skin is warm, dry and intact. No rash noted. Psychiatric: Mood and affect are normal. Speech and behavior are normal though somewhat thick.  ____________________________________________   LABS (all labs ordered are listed, but only abnormal results are displayed)  Labs Reviewed  CBC WITH DIFFERENTIAL/PLATELET - Abnormal; Notable for the following:       Result Value   RDW 15.3 (*)    All other components within normal limits  COMPREHENSIVE METABOLIC PANEL - Abnormal; Notable for the following:    Glucose, Bld 139 (*)    GFR calc non Af Amer 52 (*)    All other components within normal limits  URINALYSIS, ROUTINE W REFLEX MICROSCOPIC - Abnormal; Notable for the following:    Color, Urine YELLOW (*)    APPearance CLEAR (*)    Leukocytes, UA SMALL (*)    Bacteria, UA RARE (*)    Squamous Epithelial / LPF 0-5 (*)    All other components within normal limits  URINE CULTURE  MAGNESIUM  LEVETIRACETAM LEVEL  CBG MONITORING, ED   ____________________________________________  EKG  Reviewed and interpreted by me at 0022 Heart rate 82 QRS 1:30 QTc 510 Normal sinus rhythm Mild prolongation of the QT Changes consistent with left ventricular hypertrophy, no evidence of acute ischemia  noted ____________________________________________  RADIOLOGY  Ct Head Wo Contrast  Result Date: 10/06/2016 CLINICAL DATA:  Seizure EXAM: CT HEAD WITHOUT CONTRAST TECHNIQUE: Contiguous axial images were obtained from the base of the skull through the vertex without intravenous contrast. COMPARISON:  11/06/2010, MRI 08/14/2010 FINDINGS: Brain: No acute territorial infarction, hemorrhage or intracranial mass is seen. Old left cerebellar infarct. Old encephalomalacia in the left frontal lobe and insula with ex vacuo dilatation of the left lateral ventricle. Small old left occipital infarct. Moderate atrophy, slightly progressed. Small vessel ischemic changes of the white matter Vascular: No hyperdense vessels.  Carotid artery calcifications. Skull: No fracture or suspicious bone lesion Sinuses/Orbits: No acute abnormality Other: None IMPRESSION: No definite CT evidence for acute intracranial abnormality. Old multifocal infarcts. Electronically Signed   By: Donavan Foil M.D.   On: 10/06/2016 02:15   Dg Chest Portable 1 View  Result Date: 10/06/2016 CLINICAL DATA:  Multiple seizures today. EXAM: PORTABLE CHEST 1 VIEW COMPARISON:  11/06/2010 FINDINGS: A single AP portable view of the chest  demonstrates no focal airspace consolidation or alveolar edema. The lungs are grossly clear. There is no large effusion or pneumothorax. Cardiac and mediastinal contours appear unremarkable. IMPRESSION: No active disease. Electronically Signed   By: Andreas Newport M.D.   On: 10/06/2016 00:44    ____________________________________________   PROCEDURES  Procedure(s) performed: None  Procedures  Critical Care performed: No  ____________________________________________   INITIAL IMPRESSION / ASSESSMENT AND PLAN / ED COURSE  Pertinent labs & imaging results that were available during my care of the patient were reviewed by me and considered in my medical decision making (see chart for  details).    Clinical Course as of Oct 06 320  Mon Oct 06, 2016  0057 Patient resting comfortable. Alert. Conversant.  [MQ]    Clinical Course User Index [MQ] Delman Kitten, MD   ----------------------------------------- 3:22 AM on 10/06/2016 -----------------------------------------  Discussed case with Dr. Irish Elders of neurology, he recommends admission for monitoring due to recurrent seizures in the setting of urinary tract infection. The patient has been compliant with her medications, and adjustments may need to be made during her inpatient course.  Discussed with patient and daughter, both in agreement with plan to admit her due to multiple seizures today and treatment for urinary tract infection  ____________________________________________   FINAL CLINICAL IMPRESSION(S) / ED DIAGNOSES  Final diagnoses:  Seizures ([place])  Lower urinary tract infection, acute      NEW MEDICATIONS STARTED DURING THIS VISIT:  New Prescriptions   No medications on file     Note:  This document was prepared using Dragon voice recognition software and may include unintentional dictation errors.     Delman Kitten, MD 10/06/16 [phone]

## 2016-10-06 NOTE — Care Management (Signed)
Admitted  To [facility] under observation status with the diagnosis of seizures. Lives with daughter, Robert Bellow, [phone]). Last seen Dr. Shade Flood at [facility] Thursday. Gets prescriptions filled at [facility]. No home Health. Skilled Nursing about 8 years ago following her stroke. Doesn't remember which facility. Rolling walker and wheelchair in the home. No falls. Good appetite. Daughter helps with basic activities. Self feed. Family will transport. Shelbie Ammons RN MSN CCM Care Management [phone]

## 2016-10-06 NOTE — Progress Notes (Signed)
[facility] [place] NAME: Kirsten Beck    MR#:  [id number]  DATE OF BIRTH:  1928/04/24  SUBJECTIVE:  CHIEF COMPLAINT:  Resting comfortably with no other episodes of seizures has chronic right-sided weakness and previous stroke  REVIEW OF SYSTEMS:  CONSTITUTIONAL: No fever, fatigue or weakness.  EYES: No blurred or double vision.  EARS, NOSE, AND THROAT: No tinnitus or ear pain.  RESPIRATORY: No cough, shortness of breath, wheezing or hemoptysis.  CARDIOVASCULAR: No chest pain, orthopnea, edema.  GASTROINTESTINAL: No nausea, vomiting, diarrhea or abdominal pain.  GENITOURINARY: No dysuria, hematuria.  ENDOCRINE: No polyuria, nocturia,  HEMATOLOGY: No anemia, easy bruising or bleeding SKIN: No rash or lesion. MUSCULOSKELETAL: No joint pain or arthritis.   NEUROLOGIC: Chronic right-sided weakness No tingling, numbness.  PSYCHIATRY: No anxiety or depression.   DRUG ALLERGIES:   Allergies  Allergen Reactions  . Peanut Butter Flavor     VITALS:  Blood pressure 129/65, pulse 69, temperature 98 F (36.7 C), temperature source Oral, resp. rate 20, height 5\' 3"  (1.6 m), weight 66.4 kg (146 lb 4.8 oz), SpO2 100 %.  PHYSICAL EXAMINATION:  GENERAL:  83 y.o.-year-old patient lying in the bed with no acute distress.  EYES: Pupils equal, round, reactive to light and accommodation. No scleral icterus. Extraocular muscles intact.  HEENT: Head atraumatic, normocephalic. Oropharynx and nasopharynx clear.  NECK:  Supple, no jugular venous distention. No thyroid enlargement, no tenderness.  LUNGS: Normal breath sounds bilaterally, no wheezing, rales,rhonchi or crepitation. No use of accessory muscles of respiration.  CARDIOVASCULAR: S1, S2 normal. No murmurs, rubs, or gallops.  ABDOMEN: Soft, nontender, nondistended. Bowel sounds present. No organomegaly or mass.  EXTREMITIES: No pedal edema, cyanosis, or clubbing.  NEUROLOGIC: CChronic  right-sided weakness and aphasia from old stroke, gait not checked  PSYCHIATRIC: The patient is alert and oriented x 3.  SKIN: No obvious rash, lesion, or ulcer.    LABORATORY PANEL:   CBC  Recent Labs Lab 10/06/16 0019  WBC 8.8  HGB 12.3  HCT 36.7  PLT 250   ------------------------------------------------------------------------------------------------------------------  Chemistries   Recent Labs Lab 10/06/16 0019  NA 139  K 3.7  CL 108  CO2 23  GLUCOSE 139*  BUN 15  CREATININE 0.97  CALCIUM 9.2  MG 1.9  AST 24  ALT 15  ALKPHOS 76  BILITOT 0.9   ------------------------------------------------------------------------------------------------------------------  Cardiac Enzymes No results for input(s): TROPONINI in the last 168 hours. ------------------------------------------------------------------------------------------------------------------  RADIOLOGY:  Ct Head Wo Contrast  Result Date: 10/06/2016 CLINICAL DATA:  Seizure EXAM: CT HEAD WITHOUT CONTRAST TECHNIQUE: Contiguous axial images were obtained from the base of the skull through the vertex without intravenous contrast. COMPARISON:  11/06/2010, MRI 08/14/2010 FINDINGS: Brain: No acute territorial infarction, hemorrhage or intracranial mass is seen. Old left cerebellar infarct. Old encephalomalacia in the left frontal lobe and insula with ex vacuo dilatation of the left lateral ventricle. Small old left occipital infarct. Moderate atrophy, slightly progressed. Small vessel ischemic changes of the white matter Vascular: No hyperdense vessels.  Carotid artery calcifications. Skull: No fracture or suspicious bone lesion Sinuses/Orbits: No acute abnormality Other: None IMPRESSION: No definite CT evidence for acute intracranial abnormality. Old multifocal infarcts. Electronically Signed   By: Donavan Foil M.D.   On: 10/06/2016 02:15   Dg Chest Portable 1 View  Result Date: 10/06/2016 CLINICAL DATA:  Multiple  seizures today. EXAM: PORTABLE CHEST 1 VIEW COMPARISON:  11/06/2010 FINDINGS: A single AP portable view  of the chest demonstrates no focal airspace consolidation or alveolar edema. The lungs are grossly clear. There is no large effusion or pneumothorax. Cardiac and mediastinal contours appear unremarkable. IMPRESSION: No active disease. Electronically Signed   By: Andreas Newport M.D.   On: 10/06/2016 00:44    EKG:   Orders placed or performed during the hospital encounter of 10/06/16  . ED EKG  . ED EKG    ASSESSMENT AND PLAN:    83 year old elderly female patient with history of seizure disorder, CVA, hypertension, hyperlipidemia presented to the emergency room after having seizure. Admitting diagnosis  1. Breakthrough seizureWith history of seizure disorder Keppra dose increased to 750 by mouth twice a day Outpatient follow-up with neurology as recommended Anticipated discharge in a.m. if no other episodes of seizures   seizure precautions   2. Hypertension continue home medication Imdur, hydralazine and Coreg   3. Hyperlipidemia continue Lipitor   4. Constipation-Senokot as needed   5. History of CVA-chronic weakness with aphasia   continue home medication Lipitor patient is not on any aspirin at this time , can consider starting ASA at the time of discharge     All the records are reviewed and case discussed with Care Management/Social Workerr. Management plans discussed with the patient, family and they are in agreement.  CODE STATUS: FC   TOTAL TIME TAKING CARE OF THIS PATIENT: 34  minutes.   POSSIBLE D/C IN 1  DAYS, DEPENDING ON CLINICAL CONDITION.  Note: This dictation was prepared with Dragon dictation along with smaller phrase technology. Any transcriptional errors that result from this process are unintentional.   Nicholes Mango M.D on 10/06/2016 at 4:39 PM  Between 7am to 6pm - Pager - [phone] After 6pm go to [email or web address] - password [email or web address] [facility]  Office  [phone]  CC: Primary care physician; Patient, No Pcp Per

## 2016-10-06 NOTE — Care Management Obs Status (Signed)
[facility] NOTIFICATION   Patient Details  Name: Kirsten Beck MRN: [id number] Date of Birth: 1928-06-08   Medicare Observation Status Notification Given:  Yes    Shelbie Ammons, RN 10/06/2016, 10:03 AM

## 2016-10-06 NOTE — H&P (Signed)
[facility] [place] NAME: Kirsten Beck    MR#:  [id number]  DATE OF BIRTH:  1928-09-24  DATE OF ADMISSION:  10/06/2016  PRIMARY CARE PHYSICIAN: Patient, No Pcp Per   REQUESTING/REFERRING PHYSICIAN:   CHIEF COMPLAINT:   Chief Complaint  Patient presents with  . Seizures    HISTORY OF PRESENT ILLNESS: Kirsten Beck  is a 83 y.o. female with a known history of Breast cancer, CVA, hyperlipidemia, hypertension, seizure disorder presented to the emergency room after she was brought by the family for seizure. Patient had a seizure at home around 9:30 PM yesterday evening then she was brought to the emergency room by the family in the car where she had another seizure. Patient lost consciousness when she had a seizure for a few minutes. She is completely awake and verbally responsive in the emergency room. She has right-sided weakness from prior stroke. No history of any head injury. No complaints of any chest pain. Has good appetite. Patient has constipation and last bowel movement was one week ago. Case was discussed by ER physician with neurology who recommended observation admission and continue oral Keppra for seizures. No complaints of any hematuria, dysuria. She was worked up with CT head which showed no acute intracranial abnormality.  PAST MEDICAL HISTORY:   Past Medical History:  Diagnosis Date  . Breast cancer ([place])   . CVA (cerebral vascular accident) ([place])   . Hyperlipidemia   . Hypertension   . Seizure disorder ([place])     PAST SURGICAL HISTORY: Past Surgical History:  Procedure Laterality Date  . none      SOCIAL HISTORY:  Social History  Substance Use Topics  . Smoking status: Never Smoker  . Smokeless tobacco: Not on file  . Alcohol use No    FAMILY HISTORY:  Family History  Problem Relation Age of Onset  . CAD Neg Hx   . Diabetes Neg Hx     DRUG ALLERGIES:  Allergies  Allergen Reactions  . Peanut Butter  Flavor     REVIEW OF SYSTEMS:   CONSTITUTIONAL: No fever, fatigue or weakness.  EYES: No blurred or double vision.  EARS, NOSE, AND THROAT: No tinnitus or ear pain.  RESPIRATORY: No cough, shortness of breath, wheezing or hemoptysis.  CARDIOVASCULAR: No chest pain, orthopnea, edema.  GASTROINTESTINAL: No nausea, vomiting, diarrhea or abdominal pain.  Has constipation GENITOURINARY: No dysuria, hematuria.  ENDOCRINE: No polyuria, nocturia,  HEMATOLOGY: No anemia, easy bruising or bleeding SKIN: No rash or lesion. MUSCULOSKELETAL: No joint pain or arthritis.   NEUROLOGIC: No tingling, numbness, weakness.  Had seizure PSYCHIATRY: No anxiety or depression.   MEDICATIONS AT HOME:  Prior to Admission medications   Medication Sig Start Date End Date Taking? Authorizing Provider  atorvastatin (LIPITOR) 40 MG tablet Take 1 tablet by mouth daily. 10/01/16  Yes [provider]  carvedilol (COREG) 3.125 MG tablet Take 1 tablet by mouth 2 (two) times daily. 10/01/16  Yes [provider]  furosemide (LASIX) 40 MG tablet Take 1 tablet by mouth daily. 10/01/16  Yes [provider]  hydrALAZINE (APRESOLINE) 10 MG tablet Take 1 tablet by mouth 3 (three) times daily. 10/01/16  Yes [provider]  isosorbide mononitrate (IMDUR) 30 MG 24 hr tablet Take 1 tablet by mouth daily. 10/01/16  Yes [provider]  levETIRAcetam (KEPPRA) 500 MG tablet Take 1 tablet by mouth 2 (two) times daily. 10/01/16  Yes [provider]  potassium chloride (K-DUR,KLOR-CON) 10 MEQ tablet Take 1 tablet by mouth daily. 10/01/16  Yes [provider]      PHYSICAL EXAMINATION:   VITAL SIGNS: Blood pressure (!) 143/73, pulse (!) 54, temperature 98.5 F (36.9 C), temperature source Oral, resp. rate (!) 21, weight 65.8 kg (145 lb), SpO2 100 %.  GENERAL:  83 y.o.-year-old elderly patient lying in the bed with no acute distress.  EYES: Pupils equal, round, reactive to  light and accommodation. No scleral icterus. Extraocular muscles intact.  HEENT: Head atraumatic, normocephalic. Oropharynx dry and nasopharynx clear.  NECK:  Supple, no jugular venous distention. No thyroid enlargement, no tenderness.  LUNGS: Normal breath sounds bilaterally, no wheezing, rales,rhonchi or crepitation. No use of accessory muscles of respiration.  CARDIOVASCULAR: S1, S2 normal. No murmurs, rubs, or gallops.  ABDOMEN: Soft, nontender, nondistended. Bowel sounds present. No organomegaly or mass.  EXTREMITIES: No pedal edema, cyanosis, or clubbing.  NEUROLOGIC: Cranial nerves II through XII are intact. Muscle strength 4/5 in right upper and right lower extremities. Muscle strength 5/5 left upper and left lower extremitySensation intact. Gait not checked.  PSYCHIATRIC: The patient is alert and oriented x 3.  SKIN: No obvious rash, lesion, or ulcer.   LABORATORY PANEL:   CBC  Recent Labs Lab 10/06/16 0019  WBC 8.8  HGB 12.3  HCT 36.7  PLT 250  MCV 92.4  MCH 31.1  MCHC 33.7  RDW 15.3*  LYMPHSABS 1.7  MONOABS 0.8  EOSABS 0.2  BASOSABS 0.1   ------------------------------------------------------------------------------------------------------------------  Chemistries   Recent Labs Lab 10/06/16 0019  NA 139  K 3.7  CL 108  CO2 23  GLUCOSE 139*  BUN 15  CREATININE 0.97  CALCIUM 9.2  MG 1.9  AST 24  ALT 15  ALKPHOS 76  BILITOT 0.9   ------------------------------------------------------------------------------------------------------------------ CrCl cannot be calculated (Unknown ideal weight.). ------------------------------------------------------------------------------------------------------------------ No results for input(s): TSH, T4TOTAL, T3FREE, THYROIDAB in the last 72 hours.  Invalid input(s): FREET3   Coagulation profile No results for input(s): INR, PROTIME in the last 168  hours. ------------------------------------------------------------------------------------------------------------------- No results for input(s): DDIMER in the last 72 hours. -------------------------------------------------------------------------------------------------------------------  Cardiac Enzymes No results for input(s): CKMB, TROPONINI, MYOGLOBIN in the last 168 hours.  Invalid input(s): CK ------------------------------------------------------------------------------------------------------------------ Invalid input(s): POCBNP  ---------------------------------------------------------------------------------------------------------------  Urinalysis    Component Value Date/Time   COLORURINE YELLOW (A) 10/06/2016 0019   APPEARANCEUR CLEAR (A) 10/06/2016 0019   LABSPEC 1.015 10/06/2016 0019   PHURINE 6.0 10/06/2016 0019   GLUCOSEU NEGATIVE 10/06/2016 0019   HGBUR NEGATIVE 10/06/2016 0019   BILIRUBINUR NEGATIVE 10/06/2016 0019   KETONESUR NEGATIVE 10/06/2016 0019   PROTEINUR NEGATIVE 10/06/2016 0019   NITRITE NEGATIVE 10/06/2016 0019   LEUKOCYTESUR SMALL (A) 10/06/2016 0019     RADIOLOGY: Ct Head Wo Contrast  Result Date: 10/06/2016 CLINICAL DATA:  Seizure EXAM: CT HEAD WITHOUT CONTRAST TECHNIQUE: Contiguous axial images were obtained from the base of the skull through the vertex without intravenous contrast. COMPARISON:  11/06/2010, MRI 08/14/2010 FINDINGS: Brain: No acute territorial infarction, hemorrhage or intracranial mass is seen. Old left cerebellar infarct. Old encephalomalacia in the left frontal lobe and insula with ex vacuo dilatation of the left lateral ventricle. Small old left occipital infarct. Moderate atrophy, slightly progressed. Small vessel ischemic changes of the white matter Vascular: No hyperdense vessels.  Carotid artery calcifications. Skull: No fracture or suspicious bone lesion Sinuses/Orbits: No acute abnormality Other: None IMPRESSION: No  definite CT evidence for acute intracranial abnormality. Old multifocal infarcts. Electronically Signed  By: Donavan Foil M.D.   On: 10/06/2016 02:15   Dg Chest Portable 1 View  Result Date: 10/06/2016 CLINICAL DATA:  Multiple seizures today. EXAM: PORTABLE CHEST 1 VIEW COMPARISON:  11/06/2010 FINDINGS: A single AP portable view of the chest demonstrates no focal airspace consolidation or alveolar edema. The lungs are grossly clear. There is no large effusion or pneumothorax. Cardiac and mediastinal contours appear unremarkable. IMPRESSION: No active disease. Electronically Signed   By: Andreas Newport M.D.   On: 10/06/2016 00:44    EKG: Orders placed or performed during the hospital encounter of 10/06/16  . ED EKG  . ED EKG    IMPRESSION AND PLAN: 83 year old elderly female patient with history of seizure disorder, CVA, hypertension, hyperlipidemia presented to the emergency room after having seizure. Admitting diagnosis 1. Breakthrough seizure 2. Hypertension 3. Hyperlipidemia 4. Constipation 5. History of CVA Treatment plan Admit patient to medical floor observation bed Continue oral Keppra for seizure Fleet Enema for constipation Start patient on oral Colace and senna for constipation Resume blood pressure medication When necessary Ativan IV for seizures  All the records are reviewed and case discussed with ED provider. Management plans discussed with the patient, family and they are in agreement.  CODE STATUS:FULL CODE Surrogate decision maker : Daughter Code Status History    This patient does not have a recorded code status. Please follow your organizational policy for patients in this situation.       TOTAL TIME TAKING CARE OF THIS PATIENT: 52 minutes.    Saundra Shelling M.D on 10/06/2016 at 5:52 AM  Between 7am to 6pm - Pager - [phone]  After 6pm go to [email or web address] - password [email or web address] [facility]  Office  [phone]  CC: Primary  care physician; Patient, No Pcp Per

## 2016-10-06 NOTE — Consult Note (Signed)
Reason for Consult:Seizure Referring Physician: Gouru  CC: Seizure  HPI: Kirsten Beck is an 83 y.o. female with a history of breast CA, CVA and seizure disorder who presents with a breakthrough seizure.  Patient unable to provide much history and family not available therefore all history obtained from the chart.  It appears the patient had a seizure at home on the night of 9/24.  Patient maintained on Keppra.    Past Medical History:  Diagnosis Date  . Breast cancer ([place])   . CVA (cerebral vascular accident) ([place])   . Hyperlipidemia   . Hypertension   . Seizure disorder [facility])     Past Surgical History:  Procedure Laterality Date  . none      Family History  Problem Relation Age of Onset  . CAD Neg Hx   . Diabetes Neg Hx     Social History:  reports that she has never smoked. She has never used smokeless tobacco. She reports that she does not drink alcohol or use drugs.  Allergies  Allergen Reactions  . Peanut Butter Flavor     Medications:  I have reviewed the patient's current medications. Prior to Admission:  Prescriptions Prior to Admission  Medication Sig Dispense Refill Last Dose  . atorvastatin (LIPITOR) 40 MG tablet Take 1 tablet by mouth daily.     . carvedilol (COREG) 3.125 MG tablet Take 1 tablet by mouth 2 (two) times daily.     . furosemide (LASIX) 40 MG tablet Take 1 tablet by mouth daily.     . hydrALAZINE (APRESOLINE) 10 MG tablet Take 1 tablet by mouth 3 (three) times daily.     . isosorbide mononitrate (IMDUR) 30 MG 24 hr tablet Take 1 tablet by mouth daily.     Marland Kitchen levETIRAcetam (KEPPRA) 500 MG tablet Take 1 tablet by mouth 2 (two) times daily.     . potassium chloride (K-DUR,KLOR-CON) 10 MEQ tablet Take 1 tablet by mouth daily.      Scheduled: . atorvastatin  40 mg Oral Daily  . carvedilol  3.125 mg Oral BID  . [START ON 10/07/2016] docusate sodium  100 mg Oral BID  . enoxaparin (LOVENOX) injection  40 mg Subcutaneous Q24H  . hydrALAZINE  10 mg  Oral TID  . isosorbide mononitrate  30 mg Oral Daily  . levETIRAcetam  500 mg Oral BID    ROS: History obtained from the patient  General ROS: negative for - chills, fatigue, fever, night sweats, weight gain or weight loss Psychological ROS: negative for - behavioral disorder, hallucinations, memory difficulties, mood swings or suicidal ideation Ophthalmic ROS: negative for - blurry vision, double vision, eye pain or loss of vision ENT ROS: negative for - epistaxis, nasal discharge, oral lesions, sore throat, tinnitus or vertigo Allergy and Immunology ROS: negative for - hives or itchy/watery eyes Hematological and Lymphatic ROS: negative for - bleeding problems, bruising or swollen lymph nodes Endocrine ROS: negative for - galactorrhea, hair pattern changes, polydipsia/polyuria or temperature intolerance Respiratory ROS: negative for - cough, hemoptysis, shortness of breath or wheezing Cardiovascular ROS: negative for - chest pain, dyspnea on exertion, edema or irregular heartbeat Gastrointestinal ROS: negative for - abdominal pain, diarrhea, hematemesis, nausea/vomiting or stool incontinence Genito-Urinary ROS: negative for - dysuria, hematuria, incontinence or urinary frequency/urgency Musculoskeletal ROS: negative for - joint swelling or muscular weakness Neurological ROS: as noted in HPI Dermatological ROS: negative for rash and skin lesion changes  Physical Examination: Blood pressure 139/66, pulse 62, temperature 97.8 F (  36.6 C), temperature source Oral, resp. rate 20, height 5\' 3"  (1.6 m), weight 66.4 kg (146 lb 4.8 oz), SpO2 100 %.  HEENT-  Normocephalic, no lesions, without obvious abnormality.  Normal external eye and conjunctiva.  Normal TM's bilaterally.  Normal auditory canals and external ears. Normal external nose, mucus membranes and septum.  Normal pharynx. Cardiovascular- S1, S2 normal, pulses palpable throughout   Lungs- chest clear, no wheezing, rales, normal  symmetric air entry Abdomen- soft, non-tender; bowel sounds normal; no masses,  no organomegaly Extremities- mild LE edema Lymph-no adenopathy palpable Musculoskeletal-no joint tenderness, deformity or swelling Skin-warm and dry, no hyperpigmentation, vitiligo, or suspicious lesions  Neurological Examination   Mental Status: Alert.  Dysarthic.  Able to follow commands without difficulty. Cranial Nerves: II: Discs flat bilaterally; Visual fields grossly normal, pupils equal, round, reactive to light and accommodation III,IV, VI: ptosis not present, extra-ocular motions intact bilaterally V,VII: mild right facial droop, facial light touch sensation decreased on the right VIII: hearing normal bilaterally IX,X: gag reflex present XI: bilateral shoulder shrug XII: midline tongue extension Motor: Right : Upper extremity   3-/5    Left:     Upper extremity   5/5  Lower extremity   1-2/5    Lower extremity   5/5 Tone and bulk:normal tone throughout; no atrophy noted Sensory: Pinprick and light touch intact throughout, bilaterally Deep Tendon Reflexes: 2+ in the upper extremities and absent in the lower extremities Plantars: Right: upgoing   Left: mute Cerebellar: Unable to perform secondary to weakness Gait: not tested due to safety concerns    Laboratory Studies:   Basic Metabolic Panel:  Recent Labs Lab 10/06/16 0019  NA 139  K 3.7  CL 108  CO2 23  GLUCOSE 139*  BUN 15  CREATININE 0.97  CALCIUM 9.2  MG 1.9    Liver Function Tests:  Recent Labs Lab 10/06/16 0019  AST 24  ALT 15  ALKPHOS 76  BILITOT 0.9  PROT 7.7  ALBUMIN 4.2   No results for input(s): LIPASE, AMYLASE in the last 168 hours. No results for input(s): AMMONIA in the last 168 hours.  CBC:  Recent Labs Lab 10/06/16 0019  WBC 8.8  NEUTROABS 6.0  HGB 12.3  HCT 36.7  MCV 92.4  PLT 250    Cardiac Enzymes: No results for input(s): CKTOTAL, CKMB, CKMBINDEX, TROPONINI in the last 168  hours.  BNP: Invalid input(s): POCBNP  CBG: No results for input(s): GLUCAP in the last 168 hours.  Microbiology: No results found for this or any previous visit.  Coagulation Studies: No results for input(s): LABPROT, INR in the last 72 hours.  Urinalysis:  Recent Labs Lab 10/06/16 0019  COLORURINE YELLOW*  LABSPEC 1.015  PHURINE 6.0  GLUCOSEU NEGATIVE  HGBUR NEGATIVE  BILIRUBINUR NEGATIVE  KETONESUR NEGATIVE  PROTEINUR NEGATIVE  NITRITE NEGATIVE  LEUKOCYTESUR SMALL*    Lipid Panel:  No results found for: CHOL, TRIG, HDL, CHOLHDL, VLDL, LDLCALC  HgbA1C: No results found for: HGBA1C  Urine Drug Screen:  No results found for: LABOPIA, COCAINSCRNUR, LABBENZ, AMPHETMU, THCU, LABBARB  Alcohol Level: No results for input(s): ETH in the last 168 hours.   Imaging: Ct Head Wo Contrast  Result Date: 10/06/2016 CLINICAL DATA:  Seizure EXAM: CT HEAD WITHOUT CONTRAST TECHNIQUE: Contiguous axial images were obtained from the base of the skull through the vertex without intravenous contrast. COMPARISON:  11/06/2010, MRI 08/14/2010 FINDINGS: Brain: No acute territorial infarction, hemorrhage or intracranial mass is seen. Old left  cerebellar infarct. Old encephalomalacia in the left frontal lobe and insula with ex vacuo dilatation of the left lateral ventricle. Small old left occipital infarct. Moderate atrophy, slightly progressed. Small vessel ischemic changes of the white matter Vascular: No hyperdense vessels.  Carotid artery calcifications. Skull: No fracture or suspicious bone lesion Sinuses/Orbits: No acute abnormality Other: None IMPRESSION: No definite CT evidence for acute intracranial abnormality. Old multifocal infarcts. Electronically Signed   By: Donavan Foil M.D.   On: 10/06/2016 02:15   Dg Chest Portable 1 View  Result Date: 10/06/2016 CLINICAL DATA:  Multiple seizures today. EXAM: PORTABLE CHEST 1 VIEW COMPARISON:  11/06/2010 FINDINGS: A single AP portable view of the  chest demonstrates no focal airspace consolidation or alveolar edema. The lungs are grossly clear. There is no large effusion or pneumothorax. Cardiac and mediastinal contours appear unremarkable. IMPRESSION: No active disease. Electronically Signed   By: Andreas Newport M.D.   On: 10/06/2016 00:44     Assessment/Plan: 83 year old female with a history of breast CA, CVA and seizure disorder on Keppra who presents with breakthrough seizure.  On Keppra at 500mg  BID at home.  Appears back to baseline at this time.  Head CT reviewed and shows no acute changes.    Recommendations: 1.  Increase Keppra to 750mg  BID 2.  Patient to follow up with outpatient physicians 3.  Seizure precautions 4.  No further neurologic intervention is recommended at this time.  If further questions arise, please call or page at that time.  Thank you for allowing neurology to participate in the care of this patient.   Alexis Goodell, MD Neurology [phone] 10/06/2016, 11:21 AM

## 2016-10-07 LAB — URINE CULTURE: SPECIAL REQUESTS: NORMAL

## 2016-10-07 LAB — LEVETIRACETAM LEVEL: Levetiracetam Lvl: NOT DETECTED ug/mL (ref 10.0–40.0)

## 2016-10-07 NOTE — Progress Notes (Signed)
[facility] [place] NAME: Kirsten Beck    MR#:  [id number]  DATE OF BIRTH:  [birth year]  SUBJECTIVE:  No Further seizures. Has slow speech.   CHIEF COMPLAINT:  Resting comfortably with no other episodes of seizures has chronic right-sided weakness and previous stroke  REVIEW OF SYSTEMS:  CONSTITUTIONAL: No fever, fatigue or weakness.  EYES: No blurred or double vision.  EARS, NOSE, AND THROAT: No tinnitus or ear pain.  RESPIRATORY: No cough, shortness of breath, wheezing or hemoptysis.  CARDIOVASCULAR: No chest pain, orthopnea, edema.  GASTROINTESTINAL: No nausea, vomiting, diarrhea or abdominal pain.  GENITOURINARY: No dysuria, hematuria.  ENDOCRINE: No polyuria, nocturia,  HEMATOLOGY: No anemia, easy bruising or bleeding SKIN: No rash or lesion. MUSCULOSKELETAL: No joint pain or arthritis.   NEUROLOGIC: Chronic right-sided weakness No tingling, numbness.  PSYCHIATRY: No anxiety or depression.   DRUG ALLERGIES:   Allergies  Allergen Reactions  . Peanut Butter Flavor     VITALS:  Blood pressure (!) 142/71, pulse 61, temperature 97.9 F (36.6 C), temperature source Oral, resp. rate 16, height 5\' 3"  (1.6 m), weight 66.4 kg (146 lb 4.8 oz), SpO2 100 %.  PHYSICAL EXAMINATION:  GENERAL:  83 y.o.-year-old patient lying in the bed with no acute distress.  EYES: Pupils equal, round, reactive to light and accommodation. No scleral icterus. Extraocular muscles intact.  HEENT: Head atraumatic, normocephalic. Oropharynx and nasopharynx clear.  NECK:  Supple, no jugular venous distention. No thyroid enlargement, no tenderness.  LUNGS: Normal breath sounds bilaterally, no wheezing, rales,rhonchi or crepitation. No use of accessory muscles of respiration.  CARDIOVASCULAR: S1, S2 normal. No murmurs, rubs, or gallops.  ABDOMEN: Soft, nontender, nondistended. Bowel sounds present. No organomegaly or mass.  EXTREMITIES: No pedal edema,  cyanosis, or clubbing.  NEUROLOGIC: CChronic right-sided weakness and aphasia from old stroke, gait not checked  PSYCHIATRIC: The patient is alert and oriented x 3.  SKIN: No obvious rash, lesion, or ulcer.    LABORATORY PANEL:   CBC  Recent Labs Lab 10/06/16 0019  WBC 8.8  HGB 12.3  HCT 36.7  PLT 250   ------------------------------------------------------------------------------------------------------------------  Chemistries   Recent Labs Lab 10/06/16 0019  NA 139  K 3.7  CL 108  CO2 23  GLUCOSE 139*  BUN 15  CREATININE 0.97  CALCIUM 9.2  MG 1.9  AST 24  ALT 15  ALKPHOS 76  BILITOT 0.9   ------------------------------------------------------------------------------------------------------------------  Cardiac Enzymes No results for input(s): TROPONINI in the last 168 hours. ------------------------------------------------------------------------------------------------------------------  RADIOLOGY:  Ct Head Wo Contrast  Result Date: 10/06/2016 CLINICAL DATA:  Seizure EXAM: CT HEAD WITHOUT CONTRAST TECHNIQUE: Contiguous axial images were obtained from the base of the skull through the vertex without intravenous contrast. COMPARISON:  11/06/2010, MRI 08/14/2010 FINDINGS: Brain: No acute territorial infarction, hemorrhage or intracranial mass is seen. Old left cerebellar infarct. Old encephalomalacia in the left frontal lobe and insula with ex vacuo dilatation of the left lateral ventricle. Small old left occipital infarct. Moderate atrophy, slightly progressed. Small vessel ischemic changes of the white matter Vascular: No hyperdense vessels.  Carotid artery calcifications. Skull: No fracture or suspicious bone lesion Sinuses/Orbits: No acute abnormality Other: None IMPRESSION: No definite CT evidence for acute intracranial abnormality. Old multifocal infarcts. Electronically Signed   By: Donavan Foil M.D.   On: 10/06/2016 02:15   Dg Chest Portable 1  View  Result Date: 10/06/2016 CLINICAL DATA:  Multiple seizures today. EXAM: PORTABLE CHEST 1 VIEW  COMPARISON:  11/06/2010 FINDINGS: A single AP portable view of the chest demonstrates no focal airspace consolidation or alveolar edema. The lungs are grossly clear. There is no large effusion or pneumothorax. Cardiac and mediastinal contours appear unremarkable. IMPRESSION: No active disease. Electronically Signed   By: Andreas Newport M.D.   On: 10/06/2016 00:44    EKG:   Orders placed or performed during the hospital encounter of 10/06/16  . ED EKG  . ED EKG    ASSESSMENT AND PLAN:    83 year old elderly female patient with history of seizure disorder, CVA, hypertension, hyperlipidemia presented to the emergency room after having seizure. Admitting diagnosis  1. Breakthrough seizureWith history of seizure disorder Keppra dose increased to 750 by mouth twice a day Outpatient follow-up with neurology as recommended  Physical Therapy consult today. Patient did not get out of the bed since admission.  2. Hypertension continue home medication Imdur, hydralazine and Coreg   3. Hyperlipidemia continue Lipitor   4. Constipation-Senokot as needed   5. History of CVA-chronic weakness with aphasia   continue home medication Lipitor patient is not on any aspirin at this time , can consider starting ASA at the time of discharge   D/w RN  All the records are reviewed and case discussed with Care Management/Social Workerr. Management plans discussed with the patient, family and they are in agreement.  CODE STATUS: FC   TOTAL TIME TAKING CARE OF THIS PATIENT: 34  minutes.   POSSIBLE D/C IN 1  DAYS, DEPENDING ON CLINICAL CONDITION.  Note: This dictation was prepared with Dragon dictation along with smaller phrase technology. Any transcriptional errors that result from this process are unintentional.   Trenell Concannon M.D on 10/07/2016 at 8:34 AM  Between 7am to 6pm - Pager -  [phone] After 6pm go to [email or web address] - password [email or web address] [facility]  Office  [phone]  CC: Primary care physician; Patient, No Pcp Per

## 2016-10-07 NOTE — Evaluation (Signed)
Physical Therapy Evaluation Patient Details Name: Kirsten Beck MRN: [id number] DOB: 03-22-1929 Today's Date: 10/07/2016   History of Present Illness  presented to ER and admitted under observation secondary to breakthrough seizures (x2) with + LOC; imaging negative for acute change/injury.  Increased keppra dosing per neurology.  Clinical Impression  Upon evaluation, patient alert and oriented to self, location and general situation; follows all commands and demonstrates good effort with all therex/theract.  Baseline R UE > LE weakness noted, residual from CVA several years prior; patient denies change with this admission.  Demonstrates ability to complete bed mobility with mod indep; sit/stand, basic transfers and household-distance gait (50') with RW, cga.  Increased time required to complete; decreased balance reactions evident. Would benefit from skilled PT to address above deficits and promote optimal return to PLOF; Recommend transition to [facility] upon discharge from acute hospitalization for home safety and mobility assessment in home environment (to address set up and balance deficits).     Follow Up Recommendations Home health PT    Equipment Recommendations  Rolling walker with 5" wheels (has RW in home environment already)    Recommendations for Other Services       Precautions / Restrictions Precautions Precautions: Fall Restrictions Weight Bearing Restrictions: No      Mobility  Bed Mobility Overal bed mobility: Modified Independent                Transfers Overall transfer level: Needs assistance Equipment used: Rolling walker (2 wheeled) Transfers: Sit to/from Stand Sit to Stand: Min guard            Ambulation/Gait Ambulation/Gait assistance: Min guard Ambulation Distance (Feet): 50 Feet Assistive device: Rolling walker (2 wheeled)       General Gait Details: broad BOS, forward flexed posture with RW arms-length anterior to patient; excessive  bilat LE ER throughout gait cycle.  Broad turning radius, limited balance reactions evident.  Patient reporting performance is near baseline for her (no family to confirm).  Stairs            Wheelchair Mobility    Modified Rankin (Stroke Patients Only)       Balance Overall balance assessment: Needs assistance Sitting-balance support: No upper extremity supported;Feet supported Sitting balance-Leahy Scale: Good     Standing balance support: Bilateral upper extremity supported Standing balance-Leahy Scale: Fair                               Pertinent Vitals/Pain Pain Assessment: No/denies pain    Home Living Family/patient expects to be discharged to:: Private residence Living Arrangements: Children Available Help at Discharge: Family Type of Home: House Home Access: Level entry     Home Layout: One level Home Equipment: Environmental consultant - 2 wheels      Prior Function Level of Independence: Independent with assistive device(s)         Comments: Mod indep for ADLs, household and limited community mobility; daughter assists with ADLs, household chores as needed.  Denies fall history.     Hand Dominance   Dominant Hand: Right    Extremity/Trunk Assessment   Upper Extremity Assessment Upper Extremity Assessment:  (R shoulder generally 3-/5, elbow and wrist 4-/5; L LE grossly WFL)    Lower Extremity Assessment Lower Extremity Assessment:  (R LE grossly 4-/5, L LE grossly 4+/5.  mild non-use of R LE evident with functional mobility/transfers)       Communication  Communication:  (garbled (baseline aphasia))  Cognition Arousal/Alertness: Awake/alert Behavior During Therapy: WFL for tasks assessed/performed Overall Cognitive Status: Within Functional Limits for tasks assessed                                        General Comments      Exercises Other Exercises Other Exercises: Rolling bilat, cga/close sup-heavy use of UEs to  assist with truncal rotation.  Dep for hygiene/clothing management after incontinent bladder episode. Other Exercises: Encouraged OOB to chair-patient declined secondary to seating surface "too low".  Discussed seating surfaces and ease/safety with transfers.  Patient with good awareness for integration into home environment   Assessment/Plan    PT Assessment Patient needs continued PT services  PT Problem List Decreased strength;Decreased activity tolerance;Decreased balance;Decreased mobility;Decreased coordination       PT Treatment Interventions DME instruction;Gait training;Functional mobility training;Therapeutic activities;Therapeutic exercise;Balance training;Patient/family education    PT Goals (Current goals can be found in the Care Plan section)  Acute Rehab PT Goals Patient Stated Goal: to return home PT Goal Formulation: With patient Time For Goal Achievement: 10/21/16 Potential to Achieve Goals: Good    Frequency Min 2X/week   Barriers to discharge Decreased caregiver support      Co-evaluation               AM-PAC PT "6 Clicks" Daily Activity  Outcome Measure Difficulty turning over in bed (including adjusting bedclothes, sheets and blankets)?: None Difficulty moving from lying on back to sitting on the side of the bed? : None Difficulty sitting down on and standing up from a chair with arms (e.g., wheelchair, bedside commode, etc,.)?: A Little Help needed moving to and from a bed to chair (including a wheelchair)?: A Little Help needed walking in hospital room?: A Little Help needed climbing 3-5 steps with a railing? : A Little 6 Click Score: 20    End of Session Equipment Utilized During Treatment: Gait belt Activity Tolerance: Patient tolerated treatment well Patient left: in bed;with call bell/phone within reach;with bed alarm set Nurse Communication: Mobility status PT Visit Diagnosis: Muscle weakness (generalized) (M62.81);Unsteadiness on feet  (R26.81)    Time: 3435-6861 PT Time Calculation (min) (ACUTE ONLY): 26 min   Charges:   PT Evaluation $PT Eval Low Complexity: 1 Procedure PT Treatments $Therapeutic Activity: 8-22 mins   PT G Codes:   PT G-Codes **NOT FOR INPATIENT CLASS** Functional Assessment Tool Used: AM-PAC 6 Clicks Basic Mobility Functional Limitation: Mobility: Walking and moving around Mobility: Walking and Moving Around Current Status (U8372): At least 20 percent but less than 40 percent impaired, limited or restricted Mobility: Walking and Moving Around Goal Status [phone]): At least 1 percent but less than 20 percent impaired, limited or restricted    Zeniah Briney H. Owens Shark, PT, DPT, NCS 10/07/16, 10:31 AM [phone]

## 2016-10-08 MED ORDER — LEVETIRACETAM 750 MG PO TABS: 750.0000 mg | ORAL_TABLET | Freq: Two times a day (BID) | ORAL | 0 refills | Status: AC

## 2016-10-08 NOTE — Care Management (Signed)
Discharge to home today per Dr. Vianne Bulls.  Attempted to contact daughter Sharyn Lull x . No voice mail.   Physical therapy evaluation completed. Recommending home health and physical therapy. Would like to discuss these plans with daughter. Shelbie Ammons RN MSN CCM Care Management [phone]

## 2016-10-08 NOTE — Care Management (Signed)
Daughter, Kirsten Beck called. [facility] for Home Health, Physical therapy, and nursing assistant. Left voice mail for Kirsten Dibble RN representative for [facility]. Discharge to home today per Dr. Vianne Bulls Daughter will transport Kirsten Ammons RN MSN [facility] [phone]

## 2016-10-08 NOTE — Progress Notes (Signed)
Discharge paperwork reviewed with patient and patient's daughter who verbalized understanding. Patient is stable and ready for discharge. Patient's daughter to transport home.

## 2016-10-08 NOTE — Discharge Summary (Signed)
Kirsten Beck, is a 83 y.o. female  DOB 1928-08-01  MRN [id number].  Admission date:  10/06/2016  Admitting Physician  Saundra Shelling, MD  Discharge Date:  10/08/2016   Primary MD  Patient, No Pcp Per  Recommendations for primary care physician for things to follow:   Follow-up with PCP in one week   Admission Diagnosis  Seizures ([place]) [R56.9] Lower urinary tract infection, acute [N39.0]   Discharge Diagnosis  Seizures ([place]) [R56.9] Lower urinary tract infection, acute [N39.0]    Active Problems:   Seizure [facility])      Past Medical History:  Diagnosis Date  . Breast cancer ([place])   . CVA (cerebral vascular accident) ([place])   . Hyperlipidemia   . Hypertension   . Seizure disorder [facility])     Past Surgical History:  Procedure Laterality Date  . none         History of present illness and  Hospital Course:     Kindly see H&P for history of present illness and admission details, please review complete Labs, Consult reports and Test reports for all details in brief  HPI  from the history and physical done on the day of admission  Admitted for breakthrough seizures with loss of consciousness.  Hospital Course  83 year old female patient with history of breast cancer, CVA, hyperlipidemia, hypertension, seizure disorder admitted because of breakthrough seizure at home. Initial CAT scan of the head did not show any intracranial abnormality. Seen by neurology. Patient was given Ativan and  Put  on seizure precautions, patient was taking Keppra 500 MG twice a day at home, we increased the Keppra to 750 mg twice a day. Did not have any further seizures since hospitalization. Patient is to be on increased dose of Keppra 750 mg twice a day from now  On , follow-up with outpatient physicians. No further neurological  intervention is recommended this time. Patient is stable for discharge, seen by physical therapy, they recommended home health physical therapy.  #2 .essential hypertension: Controlled. #3 .history of previous CVA, hyperlipidemia.has slurred  speech at baseline.  Discharge Condition: stable   Follow UP      Discharge Instructions  and  Discharge Medications     Discharge Instructions    Face-to-face encounter (required for Medicare/Medicaid patients)    Complete by:  As directed    I Lunette Tapp certify that this patient is under my care and that I, or a nurse practitioner or physician's assistant working with me, had a face-to-face encounter that meets the physician face-to-face encounter requirements with this patient on 10/08/2016. The encounter with the patient was in whole, or in part for the following medical condition(s) which is the primary reason for home health care  Seizure disorder Deconditioning Essential hypertension   The encounter with the patient was in whole, or in part, for the following medical condition, which is the primary reason for home health care:  whole   I certify that, based on my findings, the following services are medically necessary home health services:  Physical therapy   Reason for Medically Necessary Home Health Services:  Therapy- Personnel officer, Public librarian   My clinical findings support the need for the above services:  Unable to leave home safely without assistance and/or assistive device   Further, I certify that my clinical findings support that this patient is homebound due to:  Unable to leave home safely without assistance   Home Health    Complete by:  As directed    To provide the following care/treatments:  PT     Allergies as of 10/08/2016      Reactions   Peanut Butter Flavor       Medication List    TAKE these medications   atorvastatin 40 MG tablet Commonly known as:  LIPITOR Take 1 tablet  by mouth daily.   carvedilol 3.125 MG tablet Commonly known as:  COREG Take 1 tablet by mouth 2 (two) times daily.   furosemide 40 MG tablet Commonly known as:  LASIX Take 1 tablet by mouth daily.   hydrALAZINE 10 MG tablet Commonly known as:  APRESOLINE Take 1 tablet by mouth 3 (three) times daily.   isosorbide mononitrate 30 MG 24 hr tablet Commonly known as:  IMDUR Take 1 tablet by mouth daily.   levETIRAcetam 750 MG tablet Commonly known as:  KEPPRA Take 1 tablet (750 mg total) by mouth 2 (two) times daily. What changed:  medication strength  how much to take   potassium chloride 10 MEQ tablet Commonly known as:  K-DUR,KLOR-CON Take 1 tablet by mouth daily.         Diet and Activity recommendation: See Discharge Instructions above   Consults obtained - Neurology, physical Therapy   Major procedures and Radiology Reports - PLEASE review detailed and final reports for all details, in brief -      Ct Head Wo Contrast  Result Date: 10/06/2016 CLINICAL DATA:  Seizure EXAM: CT HEAD WITHOUT CONTRAST TECHNIQUE: Contiguous axial images were obtained from the base of the skull through the vertex without intravenous contrast. COMPARISON:  11/06/2010, MRI 08/14/2010 FINDINGS: Brain: No acute territorial infarction, hemorrhage or intracranial mass is seen. Old left cerebellar infarct. Old encephalomalacia in the left frontal lobe and insula with ex vacuo dilatation of the left lateral ventricle. Small old left occipital infarct. Moderate atrophy, slightly progressed. Small vessel ischemic changes of the white matter Vascular: No hyperdense vessels.  Carotid artery calcifications. Skull: No fracture or suspicious bone lesion Sinuses/Orbits: No acute abnormality Other: None IMPRESSION: No definite CT evidence for acute intracranial abnormality. Old multifocal infarcts. Electronically Signed   By: Donavan Foil M.D.   On: 10/06/2016 02:15   Dg Chest Portable 1 View  Result  Date: 10/06/2016 CLINICAL DATA:  Multiple seizures today. EXAM: PORTABLE CHEST 1 VIEW COMPARISON:  11/06/2010 FINDINGS: A single AP portable view of the chest demonstrates no focal airspace consolidation or alveolar edema. The lungs are grossly clear. There is no large effusion or pneumothorax. Cardiac and mediastinal contours appear unremarkable. IMPRESSION: No active disease. Electronically Signed   By: Andreas Newport M.D.   On: 10/06/2016 00:44    Micro Results     Recent Results (from the past 240 hour(s))  Urine Culture     Status: Abnormal   Collection Time: 10/06/16 12:19 AM  Result Value Ref Range Status   Specimen Description URINE, CATHETERIZED  Final   Special Requests Normal  Final   Culture MULTIPLE SPECIES PRESENT, SUGGEST RECOLLECTION (A)  Final   Report Status 10/07/2016 FINAL  Final       Today   Subjective:   Kirsten Beck today has no headache,no chest abdominal pain,no new weakness tingling or numbnes/ Objective:   Blood pressure (!) 155/78, pulse 69, temperature 98.1 F (36.7 C), temperature source Oral, resp. rate 16, height 5\' 3"  (1.6 m), weight 66.4 kg (146 lb 4.8 oz), SpO2 100 %.  No intake or output data in the 24 hours  ending 10/08/16 0953  Exam Awake Alert, Oriented x 3, No new F.N deficits, Normal affect [place].AT,PERRAL Supple Neck,No JVD, No cervical lymphadenopathy appriciated.  Symmetrical Chest wall movement, Good air movement bilaterally, CTAB RRR,No Gallops,Rubs or new Murmurs, No Parasternal Heave +ve B.Sounds, Abd Soft, Non tender, No organomegaly appriciated, No rebound -guarding or rigidity. No Cyanosis, Clubbing or edema, No new Rash or bruise  Data Review   CBC w Diff:  Lab Results  Component Value Date   WBC 8.8 10/06/2016   HGB 12.3 10/06/2016   HCT 36.7 10/06/2016   PLT 250 10/06/2016   LYMPHOPCT 20 10/06/2016   MONOPCT 9 10/06/2016   EOSPCT 2 10/06/2016   BASOPCT 1 10/06/2016    CMP:  Lab Results  Component Value  Date   NA 139 10/06/2016   K 3.7 10/06/2016   CL 108 10/06/2016   CO2 23 10/06/2016   BUN 15 10/06/2016   CREATININE 0.97 10/06/2016   PROT 7.7 10/06/2016   ALBUMIN 4.2 10/06/2016   BILITOT 0.9 10/06/2016   ALKPHOS 76 10/06/2016   AST 24 10/06/2016   ALT 15 10/06/2016  .   Total Time in preparing paper work, data evaluation and todays exam - 62 minutes  Dajuana Palen M.D on 10/08/2016 at 9:53 AM    Note: This dictation was prepared with Dragon dictation along with smaller phrase technology. Any transcriptional errors that result from this process are unintentional.

## 2017-04-11 ENCOUNTER — Emergency Department: Payer: Medicare Other

## 2017-04-11 ENCOUNTER — Inpatient Hospital Stay
Admission: EM | Admit: 2017-04-11 | Discharge: 2017-04-15 | DRG: 981 | Disposition: A | Discharge status: Home Health | Payer: Medicare Other | Attending: Internal Medicine | Admitting: Internal Medicine

## 2017-04-11 ENCOUNTER — Encounter: Payer: Self-pay | Admitting: Emergency Medicine

## 2017-04-11 ENCOUNTER — Other Ambulatory Visit: Payer: Self-pay

## 2017-04-11 DIAGNOSIS — I959 Hypotension, unspecified: Secondary | ICD-10-CM | POA: Diagnosis not present

## 2017-04-11 DIAGNOSIS — L89313 Pressure ulcer of right buttock, stage 3: Secondary | ICD-10-CM | POA: Diagnosis present

## 2017-04-11 DIAGNOSIS — L89610 Pressure ulcer of right heel, unstageable: Secondary | ICD-10-CM | POA: Diagnosis present

## 2017-04-11 DIAGNOSIS — L8990 Pressure ulcer of unspecified site, unspecified stage: Secondary | ICD-10-CM

## 2017-04-11 DIAGNOSIS — I69398 Other sequelae of cerebral infarction: Secondary | ICD-10-CM

## 2017-04-11 DIAGNOSIS — G40909 Epilepsy, unspecified, not intractable, without status epilepticus: Secondary | ICD-10-CM | POA: Diagnosis present

## 2017-04-11 DIAGNOSIS — L89153 Pressure ulcer of sacral region, stage 3: Secondary | ICD-10-CM | POA: Diagnosis present

## 2017-04-11 DIAGNOSIS — L0390 Cellulitis, unspecified: Secondary | ICD-10-CM | POA: Diagnosis present

## 2017-04-11 DIAGNOSIS — I10 Essential (primary) hypertension: Secondary | ICD-10-CM | POA: Diagnosis present

## 2017-04-11 DIAGNOSIS — Z853 Personal history of malignant neoplasm of breast: Secondary | ICD-10-CM | POA: Diagnosis not present

## 2017-04-11 DIAGNOSIS — S31000A Unspecified open wound of lower back and pelvis without penetration into retroperitoneum, initial encounter: Secondary | ICD-10-CM

## 2017-04-11 DIAGNOSIS — Z515 Encounter for palliative care: Secondary | ICD-10-CM | POA: Diagnosis not present

## 2017-04-11 DIAGNOSIS — I69351 Hemiplegia and hemiparesis following cerebral infarction affecting right dominant side: Secondary | ICD-10-CM

## 2017-04-11 DIAGNOSIS — Z66 Do not resuscitate: Secondary | ICD-10-CM | POA: Diagnosis present

## 2017-04-11 DIAGNOSIS — L89323 Pressure ulcer of left buttock, stage 3: Secondary | ICD-10-CM | POA: Diagnosis present

## 2017-04-11 DIAGNOSIS — L89320 Pressure ulcer of left buttock, unstageable: Secondary | ICD-10-CM

## 2017-04-11 DIAGNOSIS — L89310 Pressure ulcer of right buttock, unstageable: Secondary | ICD-10-CM | POA: Diagnosis not present

## 2017-04-11 DIAGNOSIS — L89303 Pressure ulcer of unspecified buttock, stage 3: Secondary | ICD-10-CM

## 2017-04-11 DIAGNOSIS — L89150 Pressure ulcer of sacral region, unstageable: Secondary | ICD-10-CM | POA: Diagnosis not present

## 2017-04-11 DIAGNOSIS — M6282 Rhabdomyolysis: Secondary | ICD-10-CM | POA: Diagnosis present

## 2017-04-11 DIAGNOSIS — I69320 Aphasia following cerebral infarction: Secondary | ICD-10-CM

## 2017-04-11 DIAGNOSIS — I96 Gangrene, not elsewhere classified: Secondary | ICD-10-CM | POA: Diagnosis present

## 2017-04-11 DIAGNOSIS — L89512 Pressure ulcer of right ankle, stage 2: Secondary | ICD-10-CM | POA: Diagnosis present

## 2017-04-11 DIAGNOSIS — Z8673 Personal history of transient ischemic attack (TIA), and cerebral infarction without residual deficits: Secondary | ICD-10-CM

## 2017-04-11 DIAGNOSIS — N179 Acute kidney failure, unspecified: Secondary | ICD-10-CM | POA: Diagnosis present

## 2017-04-11 DIAGNOSIS — Z23 Encounter for immunization: Secondary | ICD-10-CM | POA: Diagnosis present

## 2017-04-11 DIAGNOSIS — E663 Overweight: Secondary | ICD-10-CM | POA: Diagnosis present

## 2017-04-11 DIAGNOSIS — Z6829 Body mass index (BMI) 29.0-29.9, adult: Secondary | ICD-10-CM

## 2017-04-11 DIAGNOSIS — E785 Hyperlipidemia, unspecified: Secondary | ICD-10-CM | POA: Diagnosis present

## 2017-04-11 LAB — CBC WITH DIFFERENTIAL/PLATELET
BASOS PCT: 0 %
Basophils Absolute: 0 10*3/uL (ref 0–0.1)
EOS ABS: 0.1 10*3/uL (ref 0–0.7)
EOS PCT: 1 %
HCT: 37.8 % (ref 35.0–47.0)
HEMOGLOBIN: 12.3 g/dL (ref 12.0–16.0)
Lymphocytes Relative: 4 %
Lymphs Abs: 0.5 10*3/uL — ABNORMAL LOW (ref 1.0–3.6)
MCH: 30.3 pg (ref 26.0–34.0)
MCHC: 32.5 g/dL (ref 32.0–36.0)
MCV: 93.1 fL (ref 80.0–100.0)
MONOS PCT: 6 %
Monocytes Absolute: 0.7 10*3/uL (ref 0.2–0.9)
NEUTROS PCT: 89 %
Neutro Abs: 10.6 10*3/uL — ABNORMAL HIGH (ref 1.4–6.5)
PLATELETS: 234 10*3/uL (ref 150–440)
RBC: 4.06 MIL/uL (ref 3.80–5.20)
RDW: 14.6 % — AB (ref 11.5–14.5)
WBC: 12 10*3/uL — AB (ref 3.6–11.0)

## 2017-04-11 LAB — COMPREHENSIVE METABOLIC PANEL
ALK PHOS: 106 U/L (ref 38–126)
ALT: 118 U/L — ABNORMAL HIGH (ref 14–54)
ANION GAP: 10 (ref 5–15)
AST: 183 U/L — ABNORMAL HIGH (ref 15–41)
Albumin: 2.8 g/dL — ABNORMAL LOW (ref 3.5–5.0)
BILIRUBIN TOTAL: 1.8 mg/dL — AB (ref 0.3–1.2)
BUN: 43 mg/dL — ABNORMAL HIGH (ref 6–20)
CALCIUM: 8.4 mg/dL — AB (ref 8.9–10.3)
CO2: 25 mmol/L (ref 22–32)
Chloride: 110 mmol/L (ref 101–111)
Creatinine, Ser: 1.33 mg/dL — ABNORMAL HIGH (ref 0.44–1.00)
GFR calc non Af Amer: 35 mL/min — ABNORMAL LOW (ref >60)
GFR, EST AFRICAN AMERICAN: 41 mL/min — AB (ref >60)
Glucose, Bld: 293 mg/dL — ABNORMAL HIGH (ref 65–99)
Potassium: 3.7 mmol/L (ref 3.5–5.1)
SODIUM: 145 mmol/L (ref 135–145)
TOTAL PROTEIN: 6.8 g/dL (ref 6.5–8.1)

## 2017-04-11 LAB — URINALYSIS, COMPLETE (UACMP) WITH MICROSCOPIC
Bilirubin Urine: NEGATIVE
GLUCOSE, UA: NEGATIVE mg/dL
Ketones, ur: NEGATIVE mg/dL
Leukocytes, UA: NEGATIVE
NITRITE: NEGATIVE
PH: 5 (ref 5.0–8.0)
Protein, ur: NEGATIVE mg/dL
SPECIFIC GRAVITY, URINE: 1.017 (ref 1.005–1.030)

## 2017-04-11 LAB — LACTIC ACID, PLASMA
LACTIC ACID, VENOUS: 2.8 mmol/L — AB (ref 0.5–1.9)
Lactic Acid, Venous: 2.5 mmol/L (ref 0.5–1.9)

## 2017-04-11 LAB — CK: Total CK: 5262 U/L — ABNORMAL HIGH (ref 38–234)

## 2017-04-11 MED ORDER — POTASSIUM CHLORIDE CRYS ER 10 MEQ PO TBCR
10.0000 meq | EXTENDED_RELEASE_TABLET | Freq: Every day | ORAL | Status: DC
  Administered 2017-04-11 – 2017-04-15 (×5): 10 meq via ORAL
  Filled 2017-04-11 (×5): qty 1

## 2017-04-11 MED ORDER — CARVEDILOL 6.25 MG PO TABS
3.1250 mg | ORAL_TABLET | Freq: Two times a day (BID) | ORAL | Status: DC
  Administered 2017-04-12: 3.125 mg via ORAL
  Filled 2017-04-11: qty 1

## 2017-04-11 MED ORDER — PIPERACILLIN-TAZOBACTAM 3.375 G IVPB 30 MIN
3.3750 g | Freq: Three times a day (TID) | INTRAVENOUS | Status: DC
  Filled 2017-04-11 (×2): qty 50

## 2017-04-11 MED ORDER — ONDANSETRON HCL 4 MG PO TABS: 4.0000 mg | ORAL_TABLET | Freq: Four times a day (QID) | ORAL | Status: DC | PRN

## 2017-04-11 MED ORDER — HEPARIN SODIUM (PORCINE) 5000 UNIT/ML IJ SOLN
5000.0000 [IU] | Freq: Three times a day (TID) | INTRAMUSCULAR | Status: DC
  Administered 2017-04-11 – 2017-04-15 (×11): 5000 [IU] via SUBCUTANEOUS
  Filled 2017-04-11 (×11): qty 1

## 2017-04-11 MED ORDER — ISOSORBIDE MONONITRATE ER 30 MG PO TB24: 30.0000 mg | ORAL_TABLET | Freq: Every day | ORAL | Status: DC

## 2017-04-11 MED ORDER — FUROSEMIDE 40 MG PO TABS
40.0000 mg | ORAL_TABLET | Freq: Every day | ORAL | Status: DC
  Administered 2017-04-11: 40 mg via ORAL
  Filled 2017-04-11: qty 1

## 2017-04-11 MED ORDER — INFLUENZA VAC SPLIT HIGH-DOSE 0.5 ML IM SUSY
0.5000 mL | PREFILLED_SYRINGE | INTRAMUSCULAR | Status: AC
  Administered 2017-04-15: 0.5 mL via INTRAMUSCULAR
  Filled 2017-04-11 (×2): qty 0.5

## 2017-04-11 MED ORDER — ATORVASTATIN CALCIUM 20 MG PO TABS
40.0000 mg | ORAL_TABLET | Freq: Every day | ORAL | Status: DC
  Administered 2017-04-11: 40 mg via ORAL
  Filled 2017-04-11: qty 2

## 2017-04-11 MED ORDER — PIPERACILLIN-TAZOBACTAM 3.375 G IVPB
INTRAVENOUS | Status: AC
  Filled 2017-04-11: qty 50

## 2017-04-11 MED ORDER — SODIUM CHLORIDE 0.9 % IV BOLUS (SEPSIS)
500.0000 mL | Freq: Once | INTRAVENOUS | Status: AC
  Administered 2017-04-11: 500 mL via INTRAVENOUS

## 2017-04-11 MED ORDER — HYDRALAZINE HCL 10 MG PO TABS
10.0000 mg | ORAL_TABLET | Freq: Three times a day (TID) | ORAL | Status: DC
  Filled 2017-04-11 (×2): qty 1

## 2017-04-11 MED ORDER — PNEUMOCOCCAL VAC POLYVALENT 25 MCG/0.5ML IJ INJ
0.5000 mL | INJECTION | INTRAMUSCULAR | Status: AC
Start: 2017-04-12 — End: 2017-04-15
  Administered 2017-04-15: 0.5 mL via INTRAMUSCULAR
  Filled 2017-04-11 (×2): qty 0.5

## 2017-04-11 MED ORDER — ONDANSETRON HCL 4 MG/2ML IJ SOLN: 4.0000 mg | Freq: Four times a day (QID) | INTRAMUSCULAR | Status: DC | PRN

## 2017-04-11 MED ORDER — VANCOMYCIN HCL 10 G IV SOLR
1250.0000 mg | INTRAVENOUS | Status: DC
  Filled 2017-04-11: qty 1250

## 2017-04-11 MED ORDER — POLYETHYLENE GLYCOL 3350 17 G PO PACK: 17.0000 g | PACK | Freq: Every day | ORAL | Status: DC | PRN

## 2017-04-11 MED ORDER — ACETAMINOPHEN 650 MG RE SUPP: 650.0000 mg | Freq: Four times a day (QID) | RECTAL | Status: DC | PRN

## 2017-04-11 MED ORDER — DEXTROSE IN LACTATED RINGERS 5 % IV SOLN
INTRAVENOUS | Status: DC
  Administered 2017-04-11: via INTRAVENOUS

## 2017-04-11 MED ORDER — PIPERACILLIN-TAZOBACTAM 3.375 G IVPB
3.3750 g | Freq: Three times a day (TID) | INTRAVENOUS | Status: DC
  Administered 2017-04-12 (×2): 3.375 g via INTRAVENOUS
  Filled 2017-04-11 (×2): qty 50

## 2017-04-11 MED ORDER — PIPERACILLIN-TAZOBACTAM 3.375 G IVPB 30 MIN
3.3750 g | Freq: Three times a day (TID) | INTRAVENOUS | Status: DC
  Administered 2017-04-11: 3.375 g via INTRAVENOUS

## 2017-04-11 MED ORDER — ACETAMINOPHEN 325 MG PO TABS
650.0000 mg | ORAL_TABLET | Freq: Four times a day (QID) | ORAL | Status: DC | PRN
Start: 2017-04-11 — End: 2017-04-15

## 2017-04-11 MED ORDER — HYDROCODONE-ACETAMINOPHEN 5-325 MG PO TABS
1.0000 | ORAL_TABLET | ORAL | Status: DC | PRN
  Administered 2017-04-12: 2 via ORAL
  Administered 2017-04-12: 1 via ORAL
  Administered 2017-04-12 – 2017-04-14 (×3): 2 via ORAL
  Administered 2017-04-15: 1 via ORAL
  Filled 2017-04-11 (×2): qty 2
  Filled 2017-04-11: qty 1
  Filled 2017-04-11: qty 2
  Filled 2017-04-11: qty 1
  Filled 2017-04-11: qty 2

## 2017-04-11 MED ORDER — VANCOMYCIN HCL 10 G IV SOLR
1250.0000 mg | Freq: Once | INTRAVENOUS | Status: AC
  Administered 2017-04-11: 1250 mg via INTRAVENOUS
  Filled 2017-04-11: qty 1250

## 2017-04-11 MED ORDER — LEVETIRACETAM 750 MG PO TABS
750.0000 mg | ORAL_TABLET | Freq: Two times a day (BID) | ORAL | Status: DC
  Administered 2017-04-11 – 2017-04-15 (×8): 750 mg via ORAL
  Filled 2017-04-11 (×10): qty 1

## 2017-04-11 NOTE — Progress Notes (Signed)
ANTIBIOTIC CONSULT NOTE - INITIAL  Pharmacy Consult for vancomycin Indication: wound infection  Allergies  Allergen Reactions  . Peanut Butter Flavor     Patient Measurements: Height: 5\' 3"  (160 cm) Weight: 163 lb 11.2 oz (74.3 kg) IBW/kg (Calculated) : 52.4 Adjusted Body Weight:   Vital Signs: Temp: 97.6 F (36.4 C) (12/29 2017) Temp Source: Oral (12/29 2017) BP: 105/54 (12/29 2017) Pulse Rate: 102 (12/29 2017) Intake/Output from previous day: No intake/output data recorded. Intake/Output from this shift: Total I/O In: 550 [IV Piggyback:550] Out: -   Labs: Recent Labs    04/11/17 1551  WBC 12.0*  HGB 12.3  PLT 234  CREATININE 1.33*   Estimated Creatinine Clearance: 29.3 mL/min (A) (by C-G formula based on SCr of 1.33 mg/dL (H)). No results for input(s): VANCOTROUGH, VANCOPEAK, VANCORANDOM, GENTTROUGH, GENTPEAK, GENTRANDOM, TOBRATROUGH, TOBRAPEAK, TOBRARND, AMIKACINPEAK, AMIKACINTROU, AMIKACIN in the last 72 hours.   Microbiology: No results found for this or any previous visit (from the past 720 hour(s)).  Medical History: Past Medical History:  Diagnosis Date  . Breast cancer ([place])   . CVA (cerebral vascular accident) ([place])   . Hyperlipidemia   . Hypertension   . Seizure disorder ([facility])     Medications:  Infusions:  . dextrose 5% lactated ringers    . piperacillin-tazobactam    . vancomycin    . [START ON 04/12/2017] vancomycin     Assessment: 88 yof cc wound infection with PMH left MCA infarct, seizure disorder/aphasia. Acute rhabdo noted in ED, SCr 1.3 while baseline normal. Pharmacy consulted to dose vancomycin for wound infection  Goal of Therapy:  Vancomycin trough level 15-20 mcg/ml  Plan:  Vancomycin 1.25 gm IV x 1 followed in approximately 24 hours (stacked dosing) by vancomycin 1.25 gm IV Q36H, predicted trough 17 mcg/mL. Pharmacy will continue to follow and adjust as needed to maintain trough 15 to 20 mcg/mL.   Vd 42.6 L, Ke 0.029 hr-1,  T1/2 24.2 hr  Laural Benes, Pharm.D., BCPS Clinical Pharmacist 04/11/2017,8:54 PM

## 2017-04-11 NOTE — ED Notes (Signed)
Paulette RN, aware of bed assigned 

## 2017-04-11 NOTE — ED Triage Notes (Signed)
Pt to ED from [facility]. Pt was sent over for evaluation of multiple wounds on her legs and buttocks. Pt family member states that he sores "haven't been there that long". Obvious odor from wounds.

## 2017-04-11 NOTE — ED Notes (Signed)
Unable to obtain temperature or SpO2 reading due to patient hands and ears being too cold. Charge RN was called and bed requested d/t concern for sepsis work up given multiple open wounds. Will obtain core temperature when patient is placed in a room.

## 2017-04-11 NOTE — ED Provider Notes (Addendum)
[facility] Emergency Department Provider Note  ____________________________________________   I have reviewed the triage vital signs and the nursing notes. Where available I have reviewed prior notes and, if possible and indicated, outside hospital notes.    HISTORY  Chief Complaint Wound Infection    HPI Kirsten Beck is a 83 y.o. female who presents today with her family, they are concerned about skin breakdown.  Patient stays at home with her daughter her daughter works and then during the times that her daughter works the patient is responsible for changing her diaper and she states usually this patient can ambulate from the bedroom to the kitchen.  She does not feel the patient has been acutely ill recently but they are concerned about this skin breakdown that they noted on the legs and buttocks.  Patient herself does not give much of history he does have a history of stroke with right-sided weakness, no change in that, poor speech after stroke remotely as well, patient's was most of her time in a chair, while the patient is at home alone she is usually response with changing her diaper, family states that over the last couple days they have noted the rapid progression of all of the wounds on her legs and buttocks.  No trauma.  They do not think she fell.  No recent seizures.  Patient does have a seizure disorder.  Is compliant with her medications.  Level 5 chart caveat; no further history available due to patient status.      Past Medical History:  Diagnosis Date  . Breast cancer ([place])   . CVA (cerebral vascular accident) ([place])   . Hyperlipidemia   . Hypertension   . Seizure disorder [facility])     Patient Active Problem List   Diagnosis Date Noted  . Seizure ([place]) 10/06/2016    Past Surgical History:  Procedure Laterality Date  . none      Prior to Admission medications   Medication Sig Start Date End Date Taking? Authorizing Provider   atorvastatin (LIPITOR) 40 MG tablet Take 1 tablet by mouth daily. 10/01/16   [provider]  carvedilol (COREG) 3.125 MG tablet Take 1 tablet by mouth 2 (two) times daily. 10/01/16   [provider]  furosemide (LASIX) 40 MG tablet Take 1 tablet by mouth daily. 10/01/16   [provider]  hydrALAZINE (APRESOLINE) 10 MG tablet Take 1 tablet by mouth 3 (three) times daily. 10/01/16   [provider]  isosorbide mononitrate (IMDUR) 30 MG 24 hr tablet Take 1 tablet by mouth daily. 10/01/16   [provider]  levETIRAcetam (KEPPRA) 750 MG tablet Take 1 tablet (750 mg total) by mouth 2 (two) times daily. 10/08/16   Epifanio Lesches, MD  potassium chloride (K-DUR,KLOR-CON) 10 MEQ tablet Take 1 tablet by mouth daily. 10/01/16   [provider]    Allergies Peanut butter flavor  Family History  Problem Relation Age of Onset  . CAD Neg Hx   . Diabetes Neg Hx     Social History Social History   Tobacco Use  . Smoking status: Never Smoker  . Smokeless tobacco: Never Used  Substance Use Topics  . Alcohol use: No  . Drug use: No    Review of Systems Constitutional: No fever/chills Eyes: No visual changes. ENT: No sore throat. No stiff neck no neck pain Cardiovascular: Denies chest pain. Respiratory: Denies shortness of breath. Gastrointestinal:   no vomiting.  No diarrhea.  No constipation. Genitourinary:  Negative for dysuria. Musculoskeletal: Negative lower extremity swelling Skin: Positive for rash Neurological: Negative for severe headaches, focal weakness or numbness.   ____________________________________________   PHYSICAL EXAM:  VITAL SIGNS: ED Triage Vitals  Enc Vitals Group     BP 04/11/17 1528 117/68     Pulse --      Resp 04/11/17 1544 13     Temp 04/11/17 1544 98.7 F (37.1 C)     Temp Source 04/11/17 1544 Rectal     SpO2 --      Weight --      Height --      Head Circumference --      Peak Flow --       Pain Score --      Pain Loc --      Pain Edu? --      Excl. in [place]? --     Constitutional: Alert and o dysarthria limits patient's ability to communicate, does appear to be oriented to name unsure if she knows where she is at, does follow commands as to limited extent baseline per family in that respect Eyes: Conjunctivae are normal Head: Atraumatic HEENT: No congestion/rhinnorhea. Mucous membranes are moist.  Oropharynx non-erythematous Neck:   Nontender with no meningismus, no masses, no stridor Cardiovascular: Normal rate, regular rhythm. Grossly normal heart sounds.  Good peripheral circulation. Respiratory: Normal respiratory effort.  No retractions. Lungs CTAB. Abdominal: Soft and nontender. No distention. No guarding no rebound Back:  There is no focal tenderness or step off.  there is no midline tenderness there are no lesions noted. there is no CVA tenderness Musculoskeletal: No lower extremity tenderness, no upper extremity tenderness. No joint effusions, no DVT signs strong distal pulses no edema Neurologic: Poor speech, right-sided weakness upper and lower extremity noted which is baseline per family Skin:  Skin is warm, dry and intact.  Estimate to be level 3 stage decubitus ulcers to bilateral lower extreme is multiple decubitus pressure sores to lower legs and feet Psychiatric: Mood and affect are normal. .  ____________________________________________   LABS (all labs ordered are listed, but only abnormal results are displayed)  Labs Reviewed  CBC WITH DIFFERENTIAL/PLATELET - Abnormal; Notable for the following components:      Result Value   WBC 12.0 (*)    RDW 14.6 (*)    Neutro Abs 10.6 (*)    Lymphs Abs 0.5 (*)    All other components within normal limits  URINALYSIS, COMPLETE (UACMP) WITH MICROSCOPIC  CK  COMPREHENSIVE METABOLIC PANEL  LACTIC ACID, PLASMA  LACTIC ACID, PLASMA    Pertinent labs  results that were available during my care of the patient were  reviewed by me and considered in my medical decision making (see chart for details). ____________________________________________  EKG  I personally interpreted any EKGs ordered by me or triage  ____________________________________________  RADIOLOGY  Pertinent labs & imaging results that were available during my care of the patient were reviewed by me and considered in my medical decision making (see chart for details). If possible, patient and/or family made aware of any abnormal findings.  No results found. ____________________________________________    PROCEDURES  Procedure(s) performed: None  Procedures  Critical Care performed: None  ____________________________________________   INITIAL IMPRESSION / ASSESSMENT AND PLAN / ED COURSE  Pertinent labs & imaging results that were available during my care of the patient were reviewed by me and considered in my medical decision making (see chart for details).  Here with multiple  decubitus ulcers some of them quite significant the family states is been worse over the last couple of days, patient has pressure sores in areas where she has been sitting and also an area of her diaper.  We are evaluating her for possible infection very difficult to tell with multiple pressure sores if they are infected some of them are bullae some of them are deep.  Patient and family are at bedside and appropriate.  They are surprised that the progression of her symptoms.  Likely patient will need admission.  ----------------------------------------- 5:58 PM on 04/11/2017 -----------------------------------------  We will place a Foley catheter given the patient's significant breakdown, this will also allow [place] to keep track of urine output in the context of rhabdomyolysis we are giving her IV fluid, I am doing a workup to see if there is other reasons for the patient not to be getting up and around as often, CT of the head and chest are reassuring,  urinalysis is pending we will continue hydration patient will be admitted.  Deep decubitus ulcers in the bottom, no evidence of clear osteo-, patient will need further evaluation for this and her liver function test. ,----------------------------------------- 6:28 PM on 04/11/2017 -----------------------------------------  Hospitalist will start antibiotics we will continue hydration here, we are letting Adult Protective Services no although it does appear to be the patient's family are doing the best in the circumstances.  They are at bedside, appropriate, and states that she has been doing her best to clean it with [place] soap.  I feel the patient's family likely are just overwhelmed but nonetheless this degree of skin breakdown I think would benefit from social work and APS intervention    ____________________________________________   FINAL CLINICAL IMPRESSION(S) / ED DIAGNOSES  Final diagnoses:  None      This chart was dictated using voice recognition software.  Despite best efforts to proofread,  errors can occur which can change meaning.      Schuyler Amor, MD 04/11/17 1647    Schuyler Amor, MD 04/11/17 1759    Schuyler Amor, MD 04/11/17 [phone]

## 2017-04-11 NOTE — ED Notes (Signed)
This RN reached out to Adult Protective Services to express concerns re: care at home for pt d/t severity of multiple wounds, most extensive on pt's bottom/lower back area. Anette Guarneri from APS stated she believed there might already be a case open on patient but is now aware of hospital admission d/t wound infection and impending surgical consult. Per Ms. Florene Glen, someone will be following up in the next week regarding pt.

## 2017-04-11 NOTE — H&P (Addendum)
[facility] [place] NAME: Kirsten Beck    MR#:  [id number]  DATE OF BIRTH:  12/17/1928  DATE OF ADMISSION:  04/11/2017  PRIMARY CARE PHYSICIAN: Patient, No Pcp Per   REQUESTING/REFERRING PHYSICIAN:   CHIEF COMPLAINT:   Chief Complaint  Patient presents with  . Wound Infection    HISTORY OF PRESENT ILLNESS: Kirsten Beck  is a 83 y.o. female with a known history per below, noted history of left MCA infarct with seizure disorder/aphasia, brought in to the hospital by family for skin breakdown noted on her left leg and buttocks, per family the the wounds occurred over the last couple days, per the patient's daughter/caregiver-patient is usually able to ambulate/do self care/change her diaper but over the last several days has not been able to do those things, in the emergency room patient was found to have acute rhabdomyolysis with CPK greater than 5000, creatinine 1.3 baseline normal, AST/ALT elevation, total bili 1.8, white count 12,000, patient evaluated emergency room, daughter at the bedside, patient noted to have large left lower extremity leg ulceration with surrounding cellulitis changes, noted with very large sacral decubitus pressure wound-unstageable with skin necrosis and drainage, patient is now being admitted for acute infected sacral decubitus wound with skin necrosis, left leg skin ulceration with associated cellulitis.  PAST MEDICAL HISTORY:   Past Medical History:  Diagnosis Date  . Breast cancer ([place])   . CVA (cerebral vascular accident) ([place])   . Hyperlipidemia   . Hypertension   . Seizure disorder ([place])     PAST SURGICAL HISTORY:  Past Surgical History:  Procedure Laterality Date  . none      SOCIAL HISTORY:  Social History   Tobacco Use  . Smoking status: Never Smoker  . Smokeless tobacco: Never Used  Substance Use Topics  . Alcohol use: No    FAMILY HISTORY:  Family History  Problem Relation Age of Onset   . CAD Neg Hx   . Diabetes Neg Hx     DRUG ALLERGIES:  Allergies  Allergen Reactions  . Peanut Butter Flavor     REVIEW OF SYSTEMS:  Unable to be obtained given aphasia  MEDICATIONS AT HOME:  Prior to Admission medications   Medication Sig Start Date End Date Taking? Authorizing Provider  atorvastatin (LIPITOR) 40 MG tablet Take 1 tablet by mouth daily. 10/01/16  Yes [provider]  carvedilol (COREG) 3.125 MG tablet Take 1 tablet by mouth 2 (two) times daily. 10/01/16  Yes [provider]  furosemide (LASIX) 40 MG tablet Take 1 tablet by mouth daily. 10/01/16  Yes [provider]  hydrALAZINE (APRESOLINE) 10 MG tablet Take 1 tablet by mouth 3 (three) times daily. 10/01/16  Yes [provider]  isosorbide mononitrate (IMDUR) 30 MG 24 hr tablet Take 1 tablet by mouth daily. 10/01/16  Yes [provider]  levETIRAcetam (KEPPRA) 750 MG tablet Take 1 tablet (750 mg total) by mouth 2 (two) times daily. 10/08/16  Yes Epifanio Lesches, MD  potassium chloride (K-DUR,KLOR-CON) 10 MEQ tablet Take 1 tablet by mouth daily. 10/01/16  Yes [provider]      PHYSICAL EXAMINATION:   VITAL SIGNS: Blood pressure 116/81, temperature 98.7 F (37.1 C), temperature source Rectal, resp. rate (!) 23.  GENERAL:  83 y.o.-year-old patient lying in the bed with no acute distress.  Frail-appearing EYES: Pupils equal, round, reactive to light and accommodation. No scleral icterus. Extraocular muscles intact.  HEENT: Head atraumatic,  normocephalic. Oropharynx and nasopharynx clear.  NECK:  Supple, no jugular venous distention. No thyroid enlargement, no tenderness.  LUNGS: Normal breath sounds bilaterally, no wheezing, rales,rhonchi or crepitation. No use of accessory muscles of respiration.  CARDIOVASCULAR: S1, S2 normal. No murmurs, rubs, or gallops.  ABDOMEN: Soft, nontender, nondistended. Bowel sounds present. No organomegaly or mass.  EXTREMITIES: No  pedal edema, cyanosis, or clubbing.  NEUROLOGIC: Cranial nerves II through XII are intact. MAES, right hemi-paresis, chronic aphasia. Gait not checked.  PSYCHIATRIC: The patient is alert, awake, aphasic -but able to answer questions with yes/no responses SKIN: No obvious rash, lesion, or ulcer.   LABORATORY PANEL:   CBC Recent Labs  Lab 04/11/17 1551  WBC 12.0*  HGB 12.3  HCT 37.8  PLT 234  MCV 93.1  MCH 30.3  MCHC 32.5  RDW 14.6*  LYMPHSABS 0.5*  MONOABS 0.7  EOSABS 0.1  BASOSABS 0.0   ------------------------------------------------------------------------------------------------------------------  Chemistries  Recent Labs  Lab 04/11/17 1551  NA 145  K 3.7  CL 110  CO2 25  GLUCOSE 293*  BUN 43*  CREATININE 1.33*  CALCIUM 8.4*  AST 183*  ALT 118*  ALKPHOS 106  BILITOT 1.8*   ------------------------------------------------------------------------------------------------------------------ CrCl cannot be calculated (Unknown ideal weight.). ------------------------------------------------------------------------------------------------------------------ No results for input(s): TSH, T4TOTAL, T3FREE, THYROIDAB in the last 72 hours.  Invalid input(s): FREET3   Coagulation profile No results for input(s): INR, PROTIME in the last 168 hours. ------------------------------------------------------------------------------------------------------------------- No results for input(s): DDIMER in the last 72 hours. -------------------------------------------------------------------------------------------------------------------  Cardiac Enzymes No results for input(s): CKMB, TROPONINI, MYOGLOBIN in the last 168 hours.  Invalid input(s): CK ------------------------------------------------------------------------------------------------------------------ Invalid input(s):  POCBNP  ---------------------------------------------------------------------------------------------------------------  Urinalysis    Component Value Date/Time   COLORURINE AMBER (A) 04/11/2017 1551   APPEARANCEUR CLEAR (A) 04/11/2017 1551   LABSPEC 1.017 04/11/2017 1551   PHURINE 5.0 04/11/2017 1551   GLUCOSEU NEGATIVE 04/11/2017 1551   HGBUR SMALL (A) 04/11/2017 1551   BILIRUBINUR NEGATIVE 04/11/2017 1551   KETONESUR NEGATIVE 04/11/2017 1551   PROTEINUR NEGATIVE 04/11/2017 1551   NITRITE NEGATIVE 04/11/2017 1551   LEUKOCYTESUR NEGATIVE 04/11/2017 1551     RADIOLOGY: Ct Head Wo Contrast  Result Date: 04/11/2017 CLINICAL DATA:  Long-term neurologic deficit, initial encounter EXAM: CT HEAD WITHOUT CONTRAST TECHNIQUE: Contiguous axial images were obtained from the base of the skull through the vertex without intravenous contrast. COMPARISON:  10/06/2016 FINDINGS: Brain: There are changes consistent with prior left MCA infarct and left cerebellar infarct stable from the prior exam. Basal ganglia calcifications are seen bilaterally. No findings to suggest acute hemorrhage, acute infarction or space-occupying mass lesion are seen. Vascular: No hyperdense vessel or unexpected calcification. Skull: Normal. Negative for fracture or focal lesion. Sinuses/Orbits: No acute finding. Other: None. IMPRESSION: Prior left MCA infarct and left cerebellar infarct. No acute knee is noted. Electronically Signed   By: Inez Catalina M.D.   On: 04/11/2017 17:51   Dg Chest Port 1 View  Result Date: 04/11/2017 CLINICAL DATA:  Decubitus ulcers, breast cancer EXAM: PORTABLE CHEST 1 VIEW COMPARISON:  10/06/2016 chest radiograph. FINDINGS: Surgical clips are again noted in the left axilla. Stable cardiomediastinal silhouette with top-normal heart size and tortuous atherosclerotic thoracic aorta. No pneumothorax. No pleural effusion. Lungs appear clear, with no acute consolidative airspace disease and no pulmonary  edema. IMPRESSION: No active disease. Electronically Signed   By: Ilona Sorrel M.D.   On: 04/11/2017 17:45    EKG: Orders placed or performed during the hospital encounter of 10/06/16  .  ED EKG  . ED EKG    IMPRESSION AND PLAN: 1 acute extensive infected non-unstageable sacral decubitus wound with skin necrosis Admit to regular nursing floor bed, check wound cultures, empiric vancomycin/Zosyn for now, follow-up on cultures, consult general surgery for debridement  evaluation, adult pain protocol, and continue close medical monitoring  2 acute left leg skin ulceration with associated surrounding cellulitis Wound care nurse consulted to evaluate/treat, all other plans as stated above  3 acute rhabdomyolysis Most likely secondary to immobility chronically IV fluids for rehydration, avoid nephrotoxic agents, check CPK every 8 hours, BMP daily, continue close medical monitoring  4 history of CVA with associated right hemiparesis/seizure disorder/aphasia Stable Increase nursing care as needed, aspiration/fall/skin care precautions Per daughter-patient able to do self-care/ambulate/change her own diaper as of a few days ago, clinically this is doubted-Case management to assist with disposition planning, and Adult Protective Services should be notified in discussion with ED attending for probable unintentional neglect  5 chronic seizure disorder Stable Continue Keppra and placed on seizure precautions  6 chronic benign essential hypertension Stable Continue home regiment  DNR status per patient wishes Condition stable Long-term prognosis poor DVT prophylaxis with heparin subcu Disposition to home with home health services versus nursing home placement in 3-4 days   All the records are reviewed and case discussed with ED provider. Management plans discussed with the patient, family and they are in agreement.  CODE STATUS: Code Status History    Date Active Date Inactive Code  Status Order ID Comments User Context   10/06/2016 05:53 10/08/2016 16:03 Full Code [id number]  Saundra Shelling, MD Inpatient       TOTAL TIME TAKING CARE OF THIS PATIENT: 45 minutes.    Avel Peace Sanari Offner M.D on 04/11/2017   Between 7am to 6pm - Pager - [phone]  After 6pm go to [email or web address] - password [email or web address] [facility]  Office  [phone]  CC: Primary care physician; Patient, No Pcp Per   Note: This dictation was prepared with Dragon dictation along with smaller phrase technology. Any transcriptional errors that result from this process are unintentional.

## 2017-04-12 DIAGNOSIS — L89310 Pressure ulcer of right buttock, unstageable: Secondary | ICD-10-CM

## 2017-04-12 DIAGNOSIS — L89320 Pressure ulcer of left buttock, unstageable: Secondary | ICD-10-CM

## 2017-04-12 DIAGNOSIS — L89150 Pressure ulcer of sacral region, unstageable: Secondary | ICD-10-CM

## 2017-04-12 LAB — BASIC METABOLIC PANEL
ANION GAP: 7 (ref 5–15)
BUN: 37 mg/dL — ABNORMAL HIGH (ref 6–20)
CALCIUM: 7.4 mg/dL — AB (ref 8.9–10.3)
CO2: 25 mmol/L (ref 22–32)
Chloride: 117 mmol/L — ABNORMAL HIGH (ref 101–111)
Creatinine, Ser: 1.01 mg/dL — ABNORMAL HIGH (ref 0.44–1.00)
GFR, EST AFRICAN AMERICAN: 57 mL/min — AB (ref >60)
GFR, EST NON AFRICAN AMERICAN: 49 mL/min — AB (ref >60)
Glucose, Bld: 309 mg/dL — ABNORMAL HIGH (ref 65–99)
Potassium: 3.4 mmol/L — ABNORMAL LOW (ref 3.5–5.1)
Sodium: 149 mmol/L — ABNORMAL HIGH (ref 135–145)

## 2017-04-12 LAB — CK
CK TOTAL: 2487 U/L — AB (ref 38–234)
CK TOTAL: 3987 U/L — AB (ref 38–234)
Total CK: 3169 U/L — ABNORMAL HIGH (ref 38–234)
Total CK: 1860 U/L — ABNORMAL HIGH (ref 38–234)

## 2017-04-12 LAB — MRSA PCR SCREENING: MRSA by PCR: NEGATIVE

## 2017-04-12 LAB — LACTIC ACID, PLASMA: Lactic Acid, Venous: 2.7 mmol/L (ref 0.5–1.9)

## 2017-04-12 MED ORDER — DEXTROSE 5 % IV SOLN
INTRAVENOUS | Status: AC
  Administered 2017-04-12 – 2017-04-13 (×3): via INTRAVENOUS

## 2017-04-12 MED ORDER — VANCOMYCIN HCL IN DEXTROSE 1-5 GM/200ML-% IV SOLN
1000.0000 mg | INTRAVENOUS | Status: DC
  Administered 2017-04-12 – 2017-04-14 (×3): 1000 mg via INTRAVENOUS
  Filled 2017-04-12 (×4): qty 200

## 2017-04-12 MED ORDER — VANCOMYCIN HCL IN DEXTROSE 1-5 GM/200ML-% IV SOLN
1000.0000 mg | INTRAVENOUS | Status: DC
  Filled 2017-04-12: qty 200

## 2017-04-12 MED ORDER — SODIUM CHLORIDE 0.9 % IV BOLUS (SEPSIS)
1000.0000 mL | Freq: Once | INTRAVENOUS | Status: AC
  Administered 2017-04-12: 1000 mL via INTRAVENOUS

## 2017-04-12 MED ORDER — SODIUM CHLORIDE 0.9 % IV BOLUS (SEPSIS)
500.0000 mL | Freq: Once | INTRAVENOUS | Status: AC
  Administered 2017-04-12: 500 mL via INTRAVENOUS

## 2017-04-12 MED ORDER — SODIUM CHLORIDE 0.9 % IV SOLN
1250.0000 mg | INTRAVENOUS | Status: DC
  Filled 2017-04-12: qty 1250

## 2017-04-12 MED ORDER — PIPERACILLIN-TAZOBACTAM 3.375 G IVPB
3.3750 g | Freq: Three times a day (TID) | INTRAVENOUS | Status: DC
  Administered 2017-04-12 – 2017-04-15 (×10): 3.375 g via INTRAVENOUS
  Filled 2017-04-12 (×10): qty 50

## 2017-04-12 NOTE — Progress Notes (Signed)
MD notified of low BP. Orders for 500cc bolus. Will give and recheck bp.

## 2017-04-12 NOTE — Progress Notes (Signed)
Per MD okay for RN to order air mattress.

## 2017-04-12 NOTE — Progress Notes (Signed)
ANTIBIOTIC CONSULT NOTE - INITIAL  Pharmacy Consult for vancomycin Indication: wound infection  Allergies  Allergen Reactions  . Peanut Butter Flavor     Patient Measurements: Height: 5\' 3"  (160 cm) Weight: 163 lb 11.2 oz (74.3 kg) IBW/kg (Calculated) : 52.4 Adjusted Body Weight:   Vital Signs: Temp: 98.5 F (36.9 C) (12/30 0515) Temp Source: Oral (12/30 0515) BP: 95/44 (12/30 0515) Pulse Rate: 99 (12/30 0515) Intake/Output from previous day: 12/29 0701 - 12/30 0700 In: 2328 [I.V.:500; IV Piggyback:1828] Out: 730 [Urine:730] Intake/Output from this shift: No intake/output data recorded.  Labs: Recent Labs    04/11/17 1551 04/12/17 0403  WBC 12.0*  --   HGB 12.3  --   PLT 234  --   CREATININE 1.33* 1.01*   Estimated Creatinine Clearance: 38.6 mL/min (A) (by C-G formula based on SCr of 1.01 mg/dL (H)). No results for input(s): VANCOTROUGH, VANCOPEAK, VANCORANDOM, GENTTROUGH, GENTPEAK, GENTRANDOM, TOBRATROUGH, TOBRAPEAK, TOBRARND, AMIKACINPEAK, AMIKACINTROU, AMIKACIN in the last 72 hours.   Microbiology: No results found for this or any previous visit (from the past 720 hour(s)).  Medical History: Past Medical History:  Diagnosis Date  . Breast cancer ([place])   . CVA (cerebral vascular accident) ([place])   . Hyperlipidemia   . Hypertension   . Seizure disorder ([place])     Medications:  Infusions:  . dextrose 5% lactated ringers 100 mL/hr at 04/11/17 2351  . piperacillin-tazobactam (ZOSYN)  IV Stopped (04/12/17 0423)  . vancomycin     Assessment: 88 yof cc wound infection with PMH left MCA infarct, seizure disorder/aphasia. Acute rhabdo noted in ED, SCr 1.3 while baseline normal. Pharmacy consulted to dose vancomycin for wound infection  Goal of Therapy:  Vancomycin trough level 15-20 mcg/ml  Plan:  Renal function improving. Will change dose to 1g q 24 hours starting tonight at 2200. Trough prior to the 5th total dose 1/2 @ 2130  Karyna Bessler D [place], Pharm.D.,  BCPS Clinical Pharmacist 04/12/2017,8:22 AM

## 2017-04-12 NOTE — Clinical Social Work Note (Signed)
CSW received consult for home health needs. CSW's role is facility based needs. Please consult RNCM for home health needs. CSW signing off.  Santiago Bumpers, MSW, Latanya Presser [phone]

## 2017-04-12 NOTE — Progress Notes (Signed)
[facility] [place] NAME: Kirsten Beck    MR#:  [id number]  DATE OF BIRTH:  07-Apr-1929  SUBJECTIVE:   Patient is nonverbal at this time.  Although she understands questions with yes or no.  She has been sipping water through the cup. Spoke with daughter on the phone who reports that she found patient has an ulcer on the back just about a week ago..  Per daughter patient has been verbal, taking care of herself, ambulating REVIEW OF SYSTEMS:   Review of Systems  Unable to perform ROS: Patient nonverbal   Tolerating Diet:yes Tolerating PT: pending  DRUG ALLERGIES:   Allergies  Allergen Reactions  . Peanut Butter Flavor     VITALS:  Blood pressure 122/62, pulse 96, temperature 98.5 F (36.9 C), temperature source Oral, resp. rate 17, height 5\' 3"  (1.6 m), weight 74.3 kg (163 lb 11.2 oz), SpO2 100 %.  PHYSICAL EXAMINATION:   Physical Exam  GENERAL:  83 y.o.-year-old patient lying in the bed with no acute distress.  Appears chronically ill EYES: Pupils equal, round, reactive to light and accommodation. No scleral icterus. Extraocular muscles intact.  HEENT: Head atraumatic, normocephalic. Oropharynx and nasopharynx clear.  NECK:  Supple, no jugular venous distention. No thyroid enlargement, no tenderness.  LUNGS: Normal breath sounds bilaterally, no wheezing, rales, rhonchi. No use of accessory muscles of respiration.  CARDIOVASCULAR: S1, S2 normal. No murmurs, rubs, or gallops.  ABDOMEN: Soft, nontender, nondistended. Bowel sounds present. No organomegaly or mass.  EXTREMITIES: No cyanosis, clubbing or edema b/l.    NEUROLOGIC: Unable to assess  pSYCHIATRIC:  patient is alert SKIN: Large sacral ulcer about stage III/with black necrotic skin surrounding and significant amount of discharge present Tibial shin ulcers present on both bilateral lower extremity--foam dressing present  LABORATORY PANEL:  CBC Recent Labs  Lab  04/11/17 1551  WBC 12.0*  HGB 12.3  HCT 37.8  PLT 234    Chemistries  Recent Labs  Lab 04/11/17 1551 04/12/17 0403  NA 145 149*  K 3.7 3.4*  CL 110 117*  CO2 25 25  GLUCOSE 293* 309*  BUN 43* 37*  CREATININE 1.33* 1.01*  CALCIUM 8.4* 7.4*  AST 183*  --   ALT 118*  --   ALKPHOS 106  --   BILITOT 1.8*  --    Cardiac Enzymes No results for input(s): TROPONINI in the last 168 hours. RADIOLOGY:  Ct Head Wo Contrast  Result Date: 04/11/2017 CLINICAL DATA:  Long-term neurologic deficit, initial encounter EXAM: CT HEAD WITHOUT CONTRAST TECHNIQUE: Contiguous axial images were obtained from the base of the skull through the vertex without intravenous contrast. COMPARISON:  10/06/2016 FINDINGS: Brain: There are changes consistent with prior left MCA infarct and left cerebellar infarct stable from the prior exam. Basal ganglia calcifications are seen bilaterally. No findings to suggest acute hemorrhage, acute infarction or space-occupying mass lesion are seen. Vascular: No hyperdense vessel or unexpected calcification. Skull: Normal. Negative for fracture or focal lesion. Sinuses/Orbits: No acute finding. Other: None. IMPRESSION: Prior left MCA infarct and left cerebellar infarct. No acute knee is noted. Electronically Signed   By: Inez Catalina M.D.   On: 04/11/2017 17:51   Dg Chest Port 1 View  Result Date: 04/11/2017 CLINICAL DATA:  Decubitus ulcers, breast cancer EXAM: PORTABLE CHEST 1 VIEW COMPARISON:  10/06/2016 chest radiograph. FINDINGS: Surgical clips are again noted in the left axilla. Stable cardiomediastinal silhouette with top-normal heart size and tortuous  atherosclerotic thoracic aorta. No pneumothorax. No pleural effusion. Lungs appear clear, with no acute consolidative airspace disease and no pulmonary edema. IMPRESSION: No active disease. Electronically Signed   By: Ilona Sorrel M.D.   On: 04/11/2017 17:45   ASSESSMENT AND PLAN:  Kirsten Beck  is a 83 y.o. female with a  known history per below, noted history of left MCA infarct with seizure disorder/aphasia, brought in to the hospital by family for skin breakdown noted on her left leg and buttocks, per family the the wounds occurred over the last couple days  1 acute extensive infected non-unstageable sacral decubitus wound with skin necrosis -check wound cultures - empiric vancomycin/Zosyn for now - follow-up on cultures - consult general surgery for debridement  evaluation  2 acute left leg skin ulceration with associated surrounding cellulitis Wound care nurse consulted to evaluate/treat, all other plans as stated above  3 acute rhabdomyolysis Most likely secondary to immobility chronically IV fluids for rehydration, avoid nephrotoxic agents, check CPK every 8 hours, BMP daily, continue close medical monitoring  4 history of CVA with associated right hemiparesis/seizure disorder/aphasia Stable Increase nursing care as needed, aspiration/fall/skin care precautions  Per daughter-patient able to do self-care/ambulate/change her own diaper as of a few days ago, clinically this is doubted-Case management to assist with disposition planning, and Adult Protective Services should be notified in discussion with ED attending for probable unintentional neglect. -??reliability of pt's dters side of the story  5 chronic seizure disorder Stable Continue Keppra and placed on seizure precautions  6 chronic benign essential hypertension Stable   DNR status per patient wishes Condition stable Long-term prognosis poor  DVT prophylaxis with heparin subcu  Spoke with daughter Sharyn Lull at [phone]  Case discussed with Care Management/Social Worker. Management plans discussed with the patient, family and they are in agreement.  CODE STATUS: DNR  DVT Prophylaxis: lovenox  TOTAL TIME TAKING CARE OF THIS PATIENT: *25* minutes.  >50% time spent on counselling and coordination of care  POSSIBLE D/C  IN *few* DAYS, DEPENDING ON CLINICAL CONDITION.  Note: This dictation was prepared with Dragon dictation along with smaller phrase technology. Any transcriptional errors that result from this process are unintentional.  Fritzi Mandes M.D on 04/12/2017 at 12:38 PM  Between 7am to 6pm - Pager - [phone]  After 6pm go to [email or web address] - password [email or web address]  [facility]  Office  [phone]  CC: Primary care physician; Patient, No Pcp PerPatient ID: Kirsten Beck, female   DOB: [birth year], 83 y.o.   MRN: [id number]

## 2017-04-12 NOTE — Progress Notes (Signed)
MD notified of new critical lactic acid - 2.7. And recent BP. Orders for 1L NS bolus x 1 now. Will give and continue to monitor.

## 2017-04-12 NOTE — Consult Note (Signed)
SURGICAL CONSULTATION NOTE (initial) - cpt: 99254  HISTORY OF PRESENT ILLNESS (HPI):  83 y.o. female presented to [facility] ED yesterday for evaluation of sacral pressure wound. Patient is essentially non-verbal, only answering some questions somewhat inconsistently with yes or no; accordingly history is obtained from patient's youngest stepdaughter (bedside), with whom patient reportedly lives, and from the medical record. [name] reports patient usually is able to walk with a walker and cares for herself despite being 8 years s/p Left MCA stroke with residual Right-sided weakness. Last week, while patient's stepdaughter says she was off from work, she noticed a darkened area of skin over patient's buttock. The following day, she says, she called patient's PMD, who advised patient be evaluated at urgent care clinic. Due to a long wait, patient and her stepdaughter left the urgent care and presented to [facility] ED the following day. Patient's daughter also speculates patient was either unaware of her wound or was afraid to tell anyone due to expressed fears of being placed in a nursing home, which patient's daughter says she and her 4 older siblings (mostly in [place]) have been discussing. [name] adds that patient spends many hours sitting in a chair and refuses to get up and move when advised or requested. She otherwise denies patient has indicated any pain, nor has her appetite changed, denies fever or N/V.  Surgery is consulted by medical physician Dr. Posey Pronto in this context for evaluation and management of unstageable sacrococcygeal pressure wound.  PAST MEDICAL HISTORY (PMH):  Past Medical History:  Diagnosis Date  . Breast cancer ([place])   . CVA (cerebral vascular accident) ([place])   . Hyperlipidemia   . Hypertension   . Seizure disorder ([place])      PAST SURGICAL HISTORY ([place]):  Past Surgical History:  Procedure Laterality Date  . none       MEDICATIONS:  Prior to  Admission medications   Medication Sig Start Date End Date Taking? Authorizing Provider  atorvastatin (LIPITOR) 40 MG tablet Take 1 tablet by mouth daily. 10/01/16  Yes [provider]  carvedilol (COREG) 3.125 MG tablet Take 1 tablet by mouth 2 (two) times daily. 10/01/16  Yes [provider]  furosemide (LASIX) 40 MG tablet Take 1 tablet by mouth daily. 10/01/16  Yes [provider]  hydrALAZINE (APRESOLINE) 10 MG tablet Take 1 tablet by mouth 3 (three) times daily. 10/01/16  Yes [provider]  isosorbide mononitrate (IMDUR) 30 MG 24 hr tablet Take 1 tablet by mouth daily. 10/01/16  Yes [provider]  levETIRAcetam (KEPPRA) 750 MG tablet Take 1 tablet (750 mg total) by mouth 2 (two) times daily. 10/08/16  Yes Epifanio Lesches, MD  potassium chloride (K-DUR,KLOR-CON) 10 MEQ tablet Take 1 tablet by mouth daily. 10/01/16  Yes [provider]     ALLERGIES:  Allergies  Allergen Reactions  . Peanut Butter Flavor      SOCIAL HISTORY:  Social History   Socioeconomic History  . Marital status: Widowed    Spouse name: Not on file  . Number of children: Not on file  . Years of education: Not on file  . Highest education level: Not on file  Social Needs  . Financial resource strain: Not on file  . Food insecurity - worry: Not on file  . Food insecurity - inability: Not on file  . Transportation needs - medical: Not on file  . Transportation needs - non-medical: Not on file  Occupational History  . Occupation: retired  Tobacco Use  . Smoking status: Never Smoker  . Smokeless tobacco: Never Used  Substance and Sexual Activity  . Alcohol use: No  . Drug use: No  . Sexual activity: No  Other Topics Concern  . Not on file  Social History Narrative  . Not on file    The patient currently resides (home / rehab facility / nursing home): Home The patient normally is (ambulatory / bedbound): Limited ambulation   FAMILY HISTORY:   Family History  Problem Relation Age of Onset  . CAD Neg Hx   . Diabetes Neg Hx      REVIEW OF SYSTEMS:  Constitutional: denies weight loss, fever, chills, or sweats  Eyes: denies any other vision changes, history of eye injury  ENT: denies sore throat, hearing problems  Respiratory: denies shortness of breath, wheezing  Cardiovascular: denies chest pain, palpitations  Gastrointestinal: denies abdominal pain, N/V, or diarrhea Genitourinary: denies burning with urination or urinary frequency Musculoskeletal: denies any other joint pains or cramps  Skin: denies any other rashes or skin discolorations except as per HPI Neurological: denies any other headache, dizziness, weakness  Psychiatric: denies any other depression, anxiety   All other review of systems were negative   VITAL SIGNS:  Temp:  [97.7 F (36.5 C)-99.6 F (37.6 C)] 99.6 F (37.6 C) (12/30 1955) Pulse Rate:  [80-102] 88 (12/30 1955) Resp:  [16-18] 16 (12/30 1955) BP: (91-122)/(40-62) 104/52 (12/30 1955) SpO2:  [96 %-100 %] 100 % (12/30 1955)     Height: 5\' 3"  (160 cm) Weight: 163 lb 11.2 oz (74.3 kg) BMI (Calculated): 29.01   INTAKE/OUTPUT:  This shift: Total I/O In: 42 [IV Piggyback:42] Out: 150 [Urine:150]  Last 2 shifts: @IOLAST2SHIFTS @   PHYSICAL EXAM:  Constitutional:  -- Overweight non-obese body habitus  -- Awake and alert, but essentially non-verbal (answering only few questions yes/no), no apparent distress Eyes:  -- Pupils equally round and reactive to light  -- No scleral icterus, B/L no occular discharge Ear, nose, throat: -- Neck is FROM WNL -- No jugular venous distension  Pulmonary:  -- No wheezes or rhales -- Equal breath sounds bilaterally -- Breathing non-labored at rest Cardiovascular:  -- S1, S2 present  -- No pericardial rubs  Gastrointestinal:  -- Abdomen soft, nontender, non-distended, no guarding or rebound tenderness -- No abdominal masses appreciated, pulsatile or  otherwise  Musculoskeletal and Integumentary:  -- Wounds or skin discoloration: Large sacrococcygeal pressure wound extending to B/L Left > Right buttocks, unstageable due to covered in eschar; also moderately large Right ankle skin tear/avulsion likely associated with underlying stage 2 pressure wound -- Extremities: B/L UE and LE FROM, hands and feet warm, 2+ B/L lower extremity pitting edema, calves do not appear tender Neurologic:  -- Motor function: Intact and symmetric -- Sensation: Unable to assess due to minimally-/non- verbal status Psychiatric:  -- Mood and affect WNL  Labs:  CBC Latest Ref Rng & Units 04/11/2017 10/06/2016  WBC 3.6 - 11.0 K/uL 12.0(H) 8.8  Hemoglobin 12.0 - 16.0 g/dL 12.3 12.3  Hematocrit 35.0 - 47.0 % 37.8 36.7  Platelets 150 - 440 K/uL 234 250   CMP Latest Ref Rng & Units 04/12/2017 04/11/2017 10/06/2016  Glucose 65 - 99 mg/dL 309(H) 293(H) 139(H)  BUN 6 - 20 mg/dL 37(H) 43(H) 15  Creatinine 0.44 - 1.00 mg/dL 1.01(H) 1.33(H) 0.97  Sodium 135 - 145 mmol/L 149(H) 145 139  Potassium 3.5 - 5.1 mmol/L 3.4(L) 3.7 3.7  Chloride 101 - 111 mmol/L  117(H) 110 108  CO2 22 - 32 mmol/L 25 25 23   Calcium 8.9 - 10.3 mg/dL 7.4(L) 8.4(L) 9.2  Total Protein 6.5 - 8.1 g/dL - 6.8 7.7  Total Bilirubin 0.3 - 1.2 mg/dL - 1.8(H) 0.9  Alkaline Phos 38 - 126 U/L - 106 76  AST 15 - 41 U/L - 183(H) 24  ALT 14 - 54 U/L - 118(H) 15   Imaging studies: No pertinent imaging studies available for review  Assessment/Plan: (ICD-10's: L89.150, L89.310, L89.320) 83 y.o. female with currently unstagable sacrococcygeal, bilateral buttock, and stage 2 Right ankle pressure wounds associated with impaired mobility 8 years s/p Left MCA stroke with persistent/residual Right-sided weakness, complicated by pertinent comorbidities including HTN, HLD, post-CVA seizure disorder, and a history of breast cancer.   - pressure offloading with frequent repositioning  - keep affected sites clean and dry  from feces and urine  - will plan for bedside debridement of sacrococcygeal and bilateral buttock wounds Monday, 12/31   - all risks, benefits, and alternatives to bedside sharp excisional debridement of devitalized tissue were discussed with the patient's stepdaughter, who identifies herself as responsible for patient's medical decisions and says she's been designated by patient and stepdaughter's siblings to make such decisions, and all of patient's stepdaughter's questions were answered to her expressed satisfaction, patient's stepdaughter expresses she wishes to proceed, and informed consent was obtained.   - discussed with patient's stepdaughter concerns about patient needing more assistance than she has had at home  - antibiotics and medical management of comorbidities as per primary medical team  - DVT prophylaxis  All of the above findings and recommendations were discussed with the patient and her family, and all of patient's and her family's questions were answered to their expressed satisfaction.  Thank you for the opportunity to participate in this patient's care.   -- Marilynne Drivers Rosana Hoes, MD, [place]: [facility] - Partnering for exceptional care. Office: [phone]

## 2017-04-13 DIAGNOSIS — Z8673 Personal history of transient ischemic attack (TIA), and cerebral infarction without residual deficits: Secondary | ICD-10-CM

## 2017-04-13 DIAGNOSIS — S31000A Unspecified open wound of lower back and pelvis without penetration into retroperitoneum, initial encounter: Secondary | ICD-10-CM

## 2017-04-13 DIAGNOSIS — M6282 Rhabdomyolysis: Secondary | ICD-10-CM

## 2017-04-13 DIAGNOSIS — Z515 Encounter for palliative care: Secondary | ICD-10-CM

## 2017-04-13 LAB — BASIC METABOLIC PANEL
ANION GAP: 6 (ref 5–15)
BUN: 28 mg/dL — ABNORMAL HIGH (ref 6–20)
CALCIUM: 7.2 mg/dL — AB (ref 8.9–10.3)
CO2: 23 mmol/L (ref 22–32)
CREATININE: 1.04 mg/dL — AB (ref 0.44–1.00)
Chloride: 108 mmol/L (ref 101–111)
GFR, EST AFRICAN AMERICAN: 55 mL/min — AB (ref >60)
GFR, EST NON AFRICAN AMERICAN: 47 mL/min — AB (ref >60)
Glucose, Bld: 266 mg/dL — ABNORMAL HIGH (ref 65–99)
Potassium: 3.5 mmol/L (ref 3.5–5.1)
SODIUM: 137 mmol/L (ref 135–145)

## 2017-04-13 LAB — CK
CK TOTAL: 1485 U/L — AB (ref 38–234)
CK TOTAL: 1139 U/L — AB (ref 38–234)
CK TOTAL: 1138 U/L — AB (ref 38–234)

## 2017-04-13 LAB — GLUCOSE, CAPILLARY
GLUCOSE-CAPILLARY: 185 mg/dL — AB (ref 65–99)
Glucose-Capillary: 149 mg/dL — ABNORMAL HIGH (ref 65–99)

## 2017-04-13 MED ORDER — INSULIN GLARGINE 100 UNIT/ML ~~LOC~~ SOLN
10.0000 [IU] | Freq: Every day | SUBCUTANEOUS | Status: DC
  Administered 2017-04-13 – 2017-04-14 (×2): 10 [IU] via SUBCUTANEOUS
  Filled 2017-04-13 (×3): qty 0.1

## 2017-04-13 MED ORDER — INSULIN ASPART 100 UNIT/ML ~~LOC~~ SOLN: 0.0000 [IU] | Freq: Every day | SUBCUTANEOUS | Status: DC

## 2017-04-13 MED ORDER — ASPIRIN EC 325 MG PO TBEC
325.0000 mg | DELAYED_RELEASE_TABLET | Freq: Every day | ORAL | Status: DC
  Administered 2017-04-13 – 2017-04-15 (×3): 325 mg via ORAL
  Filled 2017-04-13 (×3): qty 1

## 2017-04-13 MED ORDER — INSULIN ASPART 100 UNIT/ML ~~LOC~~ SOLN
0.0000 [IU] | Freq: Three times a day (TID) | SUBCUTANEOUS | Status: DC
  Administered 2017-04-13 – 2017-04-15 (×3): 2 [IU] via SUBCUTANEOUS
  Administered 2017-04-15: 1 [IU] via SUBCUTANEOUS
  Filled 2017-04-13 (×5): qty 1

## 2017-04-13 NOTE — Progress Notes (Signed)
Returned to bedside to perform debridement this evening. Spoke with patient's stepdaughter, who requested debridement be done tomorrow instead of today. Chart reviewed, including outcome of family meeting regarding patient's goals of care. Will discuss further with medical team tomorrow whether debridement is still indicated considering transition to hospice.  Please call if any questions or concerns.  -- Marilynne Drivers Rosana Hoes, MD, [place]: [facility] - Partnering for exceptional care. Office: [phone]

## 2017-04-13 NOTE — Consult Note (Signed)
Consultation Note Date: 04/13/2017   Patient Name: Kirsten Beck  DOB: 29-Nov-1928  MRN: [id number]  Age / Sex: 83 y.o., female  PCP: Patient, No Pcp Per Referring Physician: Max Sane, MD  Reason for Consultation: Establishing goals of care  HPI/Patient Profile: 83 y.o. female  with past medical history of L MCA stroke with residual aphasia and right sided weakness, breast cancer, seizure disorder who was admitted on 04/11/2017 with a wound infection  and rhabdomyolysis (CK 5262).  Her lactic acid peaked at 2.8.  Her albumin has dropped in the last 6 months to 2.8, and she was found to have multiple wounds on her bilateral buttocks and right ankle.  Clinical Assessment and Goals of Care:  I have reviewed medical records including EPIC notes, labs and imaging, received report from Dr. Manuella Ghazi, assessed the patient and then met at the bedside along with her step daughter and primary care taker Sharyn Lull  to discuss diagnosis prognosis, GOC, EOL wishes, disposition and options.  I introduced Palliative Medicine as specialized medical care for people living with serious illness. It focuses on providing relief from the symptoms and stress of a serious illness. The goal is to improve quality of life for both the patient and the family.  We discussed a brief life review of the patient. The patient married Michelle's father 25 years ago.  Michelle's father passed away approximately 8 years ago - the same year the patient had a massive stroke and MI (per Sharyn Lull).  Sharyn Lull has cared for her in her home over the last 8 years.  Prior to admission the patient was independent with ADLs.  She fed, bathed, and dressed herself.  She walked with a rolling walker.  Over the holidays when Sharyn Lull had a few days off of work (she works nights at [employer]) she noticed the patient having some difficulty and offered to help bathe her.   Michelle immediately noticed her wound and called the doctor.  She ended up at the emergency room.  Sharyn Lull is quite saddened at her step mother's current situation.  She does not believe her step mother realized her sacral wounds were bad.  Sharyn Lull tells me she is able to communicate quite well with the patient, and after observing them together - I believe that is accurate. The patient does not complain of any pain.  She has been eating fairly well at home.  I attempted to elicit values and goals of care important to the patient.  Sharyn Lull states (and the patient emphatically supports) that she wants the patient to improve as much as possible.  She would like full treatment for her.  Further Sharyn Lull does not want to send her step mother to SNF.  Rather she has a friend moving into their home to help with the patient while Sharyn Lull is at work.    I explained to Sharyn Lull that in an 83 yo female with declining nutrition - these wounds are not likely to heal and that the patient will be fully dependent for  ADLs.   Sharyn Lull feels strongly that if her step mother goes to a SNF she will shut down and decline very quickly.  The difference between aggressive medical intervention and comfort care was considered in light of the patient's goals of care.   At this point Sharyn Lull and the patient still want aggressive treatment.   Primary Decision Maker:  NEXT OF Rogers Blocker, Step daughter.  (4 other step children are in [place])    [place]    PT evaluation ordered.   Consider Home with full home health services vs SNF.   Patient not ready for hospice yet (and does not qualify for Hospice yet) Palliative follow up outpatient.  PMT will reassess the patient prior to d/c if her condition changes she may be eligible for Hospice.  Code Status/Advance Care Planning:  DNR   Symptom Management:   Per primary team  Prognosis:  Undetermined.  Likely less than 12 months given un-stageable  wound, declining nutrition, previous stroke.   Discharge Planning: To Be Determined      Primary Diagnoses: Present on Admission: . Cellulitis   I have reviewed the medical record, interviewed the patient and family, and examined the patient. The following aspects are pertinent.  Past Medical History:  Diagnosis Date  . Breast cancer ([place])   . CVA (cerebral vascular accident) ([place])   . Hyperlipidemia   . Hypertension   . Seizure disorder [facility])    Social History   Socioeconomic History  . Marital status: Widowed    Spouse name: None  . Number of children: None  . Years of education: None  . Highest education level: None  Social Needs  . Financial resource strain: None  . Food insecurity - worry: None  . Food insecurity - inability: None  . Transportation needs - medical: None  . Transportation needs - non-medical: None  Occupational History  . Occupation: retired  Tobacco Use  . Smoking status: Never Smoker  . Smokeless tobacco: Never Used  Substance and Sexual Activity  . Alcohol use: No  . Drug use: No  . Sexual activity: No  Other Topics Concern  . None  Social History Narrative  . None   Family History  Problem Relation Age of Onset  . CAD Neg Hx   . Diabetes Neg Hx    Scheduled Meds: . heparin  5,000 Units Subcutaneous Q8H  . Influenza vac split quadrivalent PF  0.5 mL Intramuscular Tomorrow-1000  . levETIRAcetam  750 mg Oral BID  . pneumococcal 23 valent vaccine  0.5 mL Intramuscular Tomorrow-1000  . potassium chloride  10 mEq Oral Daily   Continuous Infusions: . piperacillin-tazobactam (ZOSYN)  IV Stopped (04/13/17 1257)  . vancomycin Stopped (04/12/17 2230)   PRN Meds:.acetaminophen **OR** acetaminophen, HYDROcodone-acetaminophen, ondansetron **OR** ondansetron (ZOFRAN) IV, polyethylene glycol Allergies  Allergen Reactions  . Peanut Butter Flavor    Review of Systems no complaints of pain, constipation, anorexia, fagtigue  Physical Exam   Frail appearing elderly female who is awake and alert, able to indicate "yes" and "no" CV rrr with murmur resp no distress Abdomen soft, nt, nd  Vital Signs: BP (!) 98/55 (BP Location: Left Arm)   Pulse 84   Temp 99.7 F (37.6 C) (Oral)   Resp 16   Ht 5' 3"  (1.6 m)   Wt 74.3 kg (163 lb 11.2 oz)   SpO2 99%   BMI 29.00 kg/m  Pain Assessment: Faces POSS *See Group Information*: S-Acceptable,Sleep, easy to arouse Pain Score:  Asleep   SpO2: SpO2: 99 % O2 Device:SpO2: 99 % O2 Flow Rate: .O2 Flow Rate (L/min): 2 L/min  IO: Intake/output summary:   Intake/Output Summary (Last 24 hours) at 04/13/2017 1325 Last data filed at 04/13/2017 1031 Gross per 24 hour  Intake 2359 ml  Output 976 ml  Net 1383 ml    LBM: Last BM Date: 04/11/17 Baseline Weight: Weight: 74.3 kg (163 lb 11.2 oz) Most recent weight: Weight: 74.3 kg (163 lb 11.2 oz)     Palliative Assessment/Data: 20%     Time In: 12:00 Time Out: 1:10 Time Total: 70 min. Greater than 50%  of this time was spent counseling and coordinating care related to the above assessment and plan.  Signed by: Florentina Jenny, PA-C Palliative Medicine Pager: [phone]  Please contact Palliative Medicine Team phone at [phone] for questions and concerns.  For individual provider: See Shea Evans

## 2017-04-13 NOTE — Progress Notes (Signed)
Foley removed per MD order.  

## 2017-04-13 NOTE — Care Management (Signed)
Patient admitted from home with infected unstageable ulcers. Patient lives at home with Step daughter Sharyn Lull).  Patient has RW and WC in the home.  PCP [place].  If patient is able to, Sharyn Lull would like for patient to return home with home health services.  Patient has been open with [facility] and would like to use them again.  PT consult pending.  Christie with [facility] given a heads up referral

## 2017-04-13 NOTE — Consult Note (Signed)
[facility] Nurse wound consult note Reason for Consult:Unstageable pressure injury to sacrococcygeal region, 100% devitalized tissue to wound bed.  Surgery has planned debridement for later today. No needs for WOC team at this time.  LEft posterior lower leg with full thickness pressure injury, stage 3.  Findings consistent with increased bioburden and contamination.  Will begin antimicrobial dressing.  Wound type:pressure injury Measurement left leg:  4 cm x 3.2 cm x 0.3 cm  Bilateral feet have full thickness maroon discoloration present, all approxiamtely 1 cm in diameter.  Protecting with foam dressing and repositioning every two hours.  Low air loss mattress in place at this time.  Pressure Injury POA: Yes Dressing procedure/placement/frequency: Cleanse left leg wound with NS.  Apply Xeroform to wound bed.  Cover with ABD pad kerlix and tape. Change daily.  Conservative sharp wound debridement (CSWD performed at the bedside) to be performed by surgery today.  Albumin 2.3 today.  Woc team will follow if needed after debridement.  Domenic Moras RN BSN [place] Pager [phone]

## 2017-04-13 NOTE — Progress Notes (Signed)
Initial Nutrition Assessment  DOCUMENTATION CODES:   Obesity unspecified  INTERVENTION:  Juven 1 packet BID, each supplement provides 80 calories and 14g amino acids  Ensure Enlive po daily, each supplement provides 350 kcal and 20 grams of protein  NUTRITION DIAGNOSIS:   Increased nutrient needs related to wound healing as evidenced by estimated needs.  GOAL:   Patient will meet greater than or equal to 90% of their needs  MONITOR:   PO intake, Supplement acceptance, I & O's, Labs, Weight trends  REASON FOR ASSESSMENT:   Malnutrition Screening Tool    ASSESSMENT:   Kirsten Beck is an 83 yo female with PMH CVA with associated R hemiparesis/seizure disorder/aphasia, skin breakdown noted on L leg and buttocks presents with infected unstageable sacral decubitius wound with skin necrosis, acute L leg skin ulceration with surrounding cellulitis  Spoke with patient and daughter at bedside. Patient unable to say anything, can only nod yes or shake her head no. Most information from daughter, she states patient eats constantly throughout the day outside of meals. Patient is noted to be bedbound, with multiple wounds. Discussed with daughter, who states they come from patient not being turned and repositioned. Denies any weight loss.  Meal Completion: 25, 25, 50, 80%  Labs reviewed:  Elevated LFTs  Medications reviewed and include:  K+ 10 mEq,   NUTRITION - FOCUSED PHYSICAL EXAM:  WNL for bedbound patient  Diet Order:  DIET DYS 3 Room service appropriate? Yes; Fluid consistency: Thin  EDUCATION NEEDS:   Education needs have been addressed  Skin:  Skin Assessment: Skin Integrity Issues: Skin Integrity Issues:: Other (Comment), Stage II, Unstageable, Stage I Stage I: To Hip Stage II: To Leg Unstageable: To Back Other: Deep tissue injury to Foot  Last BM:  12/29 (Type 2)  Height:   Ht Readings from Last 1 Encounters:  04/11/17 5\' 3"  (1.6 m)    Weight:   Wt  Readings from Last 1 Encounters:  04/11/17 163 lb 11.2 oz (74.3 kg)    Ideal Body Weight:  52.27 kg  BMI:  Body mass index is 29 kg/m.  Estimated Nutritional Needs:   Kcal:  1800-2200 calories (25-30 cal/kg)  Protein:  104-119 grams (1.4-1.6g/kg)  Fluid:  1.8-2.2L   Satira Anis. Kirsten Hackel, MS, RD LDN Inpatient Clinical Dietitian Pager [phone]

## 2017-04-13 NOTE — Evaluation (Signed)
Physical Therapy Evaluation Patient Details Name: Kirsten Beck MRN: [id number] DOB: 1928/11/12 Today's Date: 04/13/2017   History of Present Illness  presented to ER secondary to skin breakdown to L LE, buttocks; admitted with acute wound infection with extensive sacral decubitus (necrosis and drainage), L LE wound/cellulitis.  Clinical Impression  Upon evaluation, patient alert and oriented to self, general location/situation; baseline expressive aphasia evident, but with consistent yes/no (given increased time for processing, initiation).  Residual R hemiparesis noted (grossly 2+ to 3-/5 throughout) with mod edema throughout R LE (RN informed/aware).  L UE/LE generally weak and deconditioned (3-/5).  All extremities appear to be generally painful and resistive to movement throughout session. Patient intermittent yelling out in pain with attempts to move LEs (L > R).  Does agree to transition towards edge of bed, max assist from therapist to complete; min assist to maintain unsupported sitting balance. Unable to tolerate bilat knee flexion for foot position under BOS during sitting attempts; thus, unable/unsafe to attempt any standing at this time.  All mobility appears very effortful and painful for patient.  Anticipate slow, guarded progression if patient able to tolerate intensive rehab process. Would benefit from skilled PT to address above deficits and promote optimal return to PLOF; recommend transition to STR upon discharge from acute hospitalization if patient able to appropriately tolerate/progress with skilled intervention.  Stepdaughter at bedside throughout session; consistently asking if patient is going to stand and/or walk during session.  Appears somewhat unrealistic and unaware of patient's current level of deconditioning and functional decline.  Will continue to educate in subsequent sessions.  If patient/family choose to discharge home, recommend full dependent care set up for  home environment (I.e., hospital bed, hoyer lift, manual WC, BSC), as patient not likely to be ambulatory in any functional capacity upon discharge from acute hospitalization.  ----------------------------------------------------------  Patient suffers from CVA, generalized muscle weakness/deconditioning which impairs his/her ability to perform daily activities like toileting, feeding, dressing, grooming, bathing in the home. A cane, walker, or crutch will not resolve the patient's issue with performing activities of daily living. A wheelchair is required/recommended and will allow patient to safely perform daily activities.   Patient can safely propel the wheelchair in the home or has a caregiver who can provide assistance.       Follow Up Recommendations SNF    Equipment Recommendations       Recommendations for Other Services       Precautions / Restrictions Precautions Precautions: Fall Restrictions Weight Bearing Restrictions: No      Mobility  Bed Mobility Overal bed mobility: Needs Assistance Bed Mobility: Sit to Supine;Supine to Sit     Supine to sit: Max assist Sit to supine: Total assist;+2 for physical assistance      Transfers                 General transfer comment: unsafe/unable  Ambulation/Gait             General Gait Details: unsafe/unable  Stairs            Wheelchair Mobility    Modified Rankin (Stroke Patients Only)       Balance Overall balance assessment: Needs assistance Sitting-balance support: No upper extremity supported;Feet supported Sitting balance-Leahy Scale: Poor Sitting balance - Comments: maintains LE position 8-10" anterior to BOS (limited closed-chain WBing bilat LEs to stabilize self); poor trunk endurance with increasing forward trunk flexion as fatigue increased       Standing balance comment: unsafe/uanble  Pertinent Vitals/Pain Pain Assessment:  Faces Faces Pain Scale: Hurts whole lot Pain Intervention(s): Limited activity within patient's tolerance;Monitored during session;Repositioned    Home Living Family/patient expects to be discharged to:: Private residence Living Arrangements: Children Available Help at Discharge: Family Type of Home: House Home Access: Level entry     Home Layout: One level Home Equipment: Environmental consultant - 2 wheels      Prior Function Level of Independence: Needs assistance         Comments: Mod indep for ADLs, household and limited community mobility; daughter assists with ADLs, household chores as needed.  Denies fall history.  Baseline aphasia from prior CVA, but able to make needs known.     Hand Dominance        Extremity/Trunk Assessment   Upper Extremity Assessment Upper Extremity Assessment: (R UE grossly 3-/5, L UE grossly 4-/5)    Lower Extremity Assessment Lower Extremity Assessment: (R LE grossly 2+ to 3-/5, L LE grossly 3-/5.  Bilat ankles to neutral with DF, but with noted pain during any stretch/overpressure.  Increased edema noted R > L LE)       Communication   Communication: (baseline expressive aphasia, consistent yes/no with increased time)  Cognition Arousal/Alertness: Awake/alert Behavior During Therapy: WFL for tasks assessed/performed Overall Cognitive Status: Difficult to assess                                        General Comments      Exercises Other Exercises Other Exercises: Supine LE therex, 1x5, act assist ROM: ankle pumps, heel slides (open chain, off mattress surface), hip abduct/adduct.  Patient very slow and guarded, extensive encouragement for movement in all planes.   Assessment/Plan    PT Assessment Patient needs continued PT services  PT Problem List Decreased strength;Decreased coordination;Decreased activity tolerance;Decreased balance;Decreased range of motion;Decreased mobility;Decreased knowledge of use of DME;Decreased  safety awareness;Decreased skin integrity;Pain       PT Treatment Interventions DME instruction;Gait training;Functional mobility training;Therapeutic activities;Therapeutic exercise;Balance training;Patient/family education    PT Goals (Current goals can be found in the Care Plan section)  Acute Rehab PT Goals Patient Stated Goal: per chart, family wishing to take home with full home health PT services PT Goal Formulation: With patient/family Time For Goal Achievement: 04/27/17 Potential to Achieve Goals: Fair    Frequency Min 2X/week   Barriers to discharge Decreased caregiver support      Co-evaluation               AM-PAC PT "6 Clicks" Daily Activity  Outcome Measure Difficulty turning over in bed (including adjusting bedclothes, sheets and blankets)?: Unable Difficulty moving from lying on back to sitting on the side of the bed? : Unable Difficulty sitting down on and standing up from a chair with arms (e.g., wheelchair, bedside commode, etc,.)?: Unable Help needed moving to and from a bed to chair (including a wheelchair)?: Total Help needed walking in hospital room?: Total Help needed climbing 3-5 steps with a railing? : Total 6 Click Score: 6    End of Session Equipment Utilized During Treatment: Gait belt Activity Tolerance: Patient tolerated treatment well Patient left: in bed;with call bell/phone within reach;with bed alarm set;with family/visitor present Nurse Communication: Mobility status PT Visit Diagnosis: Muscle weakness (generalized) (M62.81);Difficulty in walking, not elsewhere classified (R26.2);Pain    Time: 3710-6269 PT Time Calculation (min) (ACUTE ONLY): 31 min  Charges:   PT Evaluation $PT Eval Moderate Complexity: 1 Mod PT Treatments $Therapeutic Exercise: 8-22 mins   PT G Codes:   PT G-Codes **NOT FOR INPATIENT CLASS** Functional Assessment Tool Used: AM-PAC 6 Clicks Basic Mobility Functional Limitation: Mobility: Walking and moving  around Mobility: Walking and Moving Around Current Status (P6195): At least 80 percent but less than 100 percent impaired, limited or restricted Mobility: Walking and Moving Around Goal Status [phone]): At least 20 percent but less than 40 percent impaired, limited or restricted   Keegan Bensch H. Owens Shark, [place], DPT, NCS 04/13/17, 5:17 PM [phone]

## 2017-04-13 NOTE — Progress Notes (Signed)
Inpatient Diabetes Program Recommendations  AACE/ADA: New Consensus Statement on Inpatient Glycemic Control (2015)  Target Ranges:  Prepandial:   less than 140 mg/dL      Peak postprandial:   less than 180 mg/dL (1-2 hours)      Critically ill patients:  140 - 180 mg/dL   Results for AMERAH, PULEO (MRN [id number]) as of 04/13/2017 11:48  Ref. Range 04/11/2017 15:51 04/12/2017 04:03 04/13/2017 05:02  Glucose Latest Ref Range: 65 - 99 mg/dL 293 (H) 309 (H) 266 (H)    Admit with: Sacral Wound/ Acute Rhabdomyolysis  NO History of DM noted.      MD- Note patient with elevated lab glucose levels the last 3 days.  NO History of DM noted.  If within goals of care for this patient, please consider placing orders for Novolog Sensitive Correction Scale/ SSI (0-9 units) TID AC + HS      --Will follow patient during hospitalization--  Wyn Quaker RN, MSN, CDE Diabetes Coordinator Inpatient Glycemic Control Team Team Pager: [phone] (8a-5p)

## 2017-04-13 NOTE — Progress Notes (Signed)
Family Meeting Note  Advance Directive:yes  Today a meeting took place with the Patient and Stepdaughter.  Patient is unable to participate due MO:LMBEM[facility] capacity Nonverbal   The following clinical team members were present during this meeting:MD  The following were discussed:Patient's diagnosis:   83 y.o. female  with past medical history of L MCA stroke with residual aphasia and right sided weakness, breast cancer, seizure disorder who was admitted on 04/11/2017 with a wound infection  and rhabdomyolysis   Patient's progosis: > 12 months and Goals for treatment: DNR  Additional follow-up to be provided: Palliative care consultation likely home with home health and palliative care to follow at home with a transition to hospice when she qualifies  Time spent during discussion:20 minutes  Max Sane, MD

## 2017-04-13 NOTE — Care Management Important Message (Signed)
Important Message  Patient Details  Name: ROSIBEL GIACOBBE MRN: [id number] Date of Birth: 1929-03-24   Medicare Important Message Given:  Yes    Beverly Sessions, RN 04/13/2017, 3:49 PM

## 2017-04-13 NOTE — Progress Notes (Addendum)
[facility] [place] NAME: Yatzil Clippinger    MR#:  [id number]  DATE OF BIRTH:  07-17-1928  SUBJECTIVE:  Patient is nonverbal at this time.  Stepdaughter is at bedside.  She is concerned about her overall health REVIEW OF SYSTEMS:   Review of Systems  Unable to perform ROS: Patient nonverbal   Tolerating Diet:yes Tolerating PT: pending  DRUG ALLERGIES:   Allergies  Allergen Reactions  . Peanut Butter Flavor     VITALS:  Blood pressure (!) 90/58, pulse 81, temperature 99.3 F (37.4 C), temperature source Oral, resp. rate 16, height 5\' 3"  (1.6 m), weight 74.3 kg (163 lb 11.2 oz), SpO2 100 %.  PHYSICAL EXAMINATION:   Physical Exam  GENERAL:  83 y.o.-year-old patient lying in the bed with no acute distress.  Appears chronically ill EYES: Pupils equal, round, reactive to light and accommodation. No scleral icterus. Extraocular muscles intact.  HEENT: Head atraumatic, normocephalic. Oropharynx and nasopharynx clear.  NECK:  Supple, no jugular venous distention. No thyroid enlargement, no tenderness.  LUNGS: Normal breath sounds bilaterally, no wheezing, rales, rhonchi. No use of accessory muscles of respiration.  CARDIOVASCULAR: S1, S2 normal. No murmurs, rubs, or gallops.  ABDOMEN: Soft, nontender, nondistended. Bowel sounds present. No organomegaly or mass.  EXTREMITIES: No cyanosis, clubbing or edema b/l.    NEUROLOGIC: Unable to assess  pSYCHIATRIC:  patient is alert SKIN: Large sacral ulcer about stage III/with black necrotic skin surrounding and significant amount of discharge present Tibial shin ulcers present on both bilateral lower extremity--foam dressing present LABORATORY PANEL:  CBC Recent Labs  Lab 04/11/17 1551  WBC 12.0*  HGB 12.3  HCT 37.8  PLT 234    Chemistries  Recent Labs  Lab 04/11/17 1551  04/13/17 0502  NA 145   < > 137  K 3.7   < > 3.5  CL 110   < > 108  CO2 25   < > 23  GLUCOSE 293*   <  > 266*  BUN 43*   < > 28*  CREATININE 1.33*   < > 1.04*  CALCIUM 8.4*   < > 7.2*  AST 183*  --   --   ALT 118*  --   --   ALKPHOS 106  --   --   BILITOT 1.8*  --   --    < > = values in this interval not displayed.   Cardiac Enzymes No results for input(s): TROPONINI in the last 168 hours. RADIOLOGY:  Ct Head Wo Contrast  Result Date: 04/11/2017 CLINICAL DATA:  Long-term neurologic deficit, initial encounter EXAM: CT HEAD WITHOUT CONTRAST TECHNIQUE: Contiguous axial images were obtained from the base of the skull through the vertex without intravenous contrast. COMPARISON:  10/06/2016 FINDINGS: Brain: There are changes consistent with prior left MCA infarct and left cerebellar infarct stable from the prior exam. Basal ganglia calcifications are seen bilaterally. No findings to suggest acute hemorrhage, acute infarction or space-occupying mass lesion are seen. Vascular: No hyperdense vessel or unexpected calcification. Skull: Normal. Negative for fracture or focal lesion. Sinuses/Orbits: No acute finding. Other: None. IMPRESSION: Prior left MCA infarct and left cerebellar infarct. No acute knee is noted. Electronically Signed   By: Inez Catalina M.D.   On: 04/11/2017 17:51   Dg Chest Port 1 View  Result Date: 04/11/2017 CLINICAL DATA:  Decubitus ulcers, breast cancer EXAM: PORTABLE CHEST 1 VIEW COMPARISON:  10/06/2016 chest radiograph. FINDINGS: Surgical clips are  again noted in the left axilla. Stable cardiomediastinal silhouette with top-normal heart size and tortuous atherosclerotic thoracic aorta. No pneumothorax. No pleural effusion. Lungs appear clear, with no acute consolidative airspace disease and no pulmonary edema. IMPRESSION: No active disease. Electronically Signed   By: Ilona Sorrel M.D.   On: 04/11/2017 17:45   ASSESSMENT AND PLAN:  Talar Fraley  is a 83 y.o. female with a known history per below, noted history of left MCA infarct with seizure disorder/aphasia, brought in to the  hospital by family for skin breakdown noted on her left leg and buttocks, per family the the wounds occurred over the last couple days  1 Acute extensive infected unstageable sacrococcygeal region decubitus wound with skin necrosis - pending wound cultures - empiric vancomycin/Zosyn for now - Left posterior lower leg with full thickness pressure injury, stage 3.  Findings consistent with increased bioburden and contamination. Wound care nurse recommends antimicrobial dressing.  - Wound type:pressure injury - per wound care nurse Measurement left leg:  4 cm x 3.2 cm x 0.3 cm  - Bilateral feet have full thickness maroon discoloration present, all approxiamtely 1 cm in diameter.   - general surgery planning for debridement at bedside later today  2.  Acute renal failure -Improving with hydration. likely prerenal  3. acute rhabdomyolysis Most likely secondary to immobility chronically - continue IV fluids for rehydration, CK 1139 today  4 history of CVA with associated right hemiparesis/seizure disorder/aphasia Increase nursing care as needed, aspiration/fall/skin care precautions  5 chronic seizure disorder Continue Keppra and placed on seizure precautions  6  hypotension with a history of hypertension -Better with IV hydration   DNR status per patient wishes Condition stable Long-term prognosis poor  -Consult palliative care  DVT prophylaxis with heparin subcu  Spoke with Step-daughter Sharyn Lull at bedside. Her number is [phone]  Case discussed with Care Management/Social Worker. Management plans discussed with the patient, family and they are in agreement.  CODE STATUS: DNR  DVT Prophylaxis: lovenox  TOTAL TIME TAKING CARE OF THIS PATIENT: *25* minutes.  >50% time spent on counselling and coordination of care  POSSIBLE D/C IN 1-2 DAYS, DEPENDING ON CLINICAL CONDITION.  Likely home with home health and palliative care to follow  Note: This dictation was  prepared with Dragon dictation along with smaller phrase technology. Any transcriptional errors that result from this process are unintentional.  Max Sane M.D on 04/13/2017 at 4:05 PM  Between 7am to 6pm - Pager - [phone]  After 6pm go to [email or web address] - password [email or web address]  [facility]  Office  [phone]  CC: Primary care physician; Patient, No Pcp PerPatient ID: NANCI LAKATOS, female   DOB: 09/30/1928, 83 y.o.   MRN: [id number]

## 2017-04-14 DIAGNOSIS — L89153 Pressure ulcer of sacral region, stage 3: Secondary | ICD-10-CM

## 2017-04-14 LAB — CBC
HCT: 26.9 % — ABNORMAL LOW (ref 35.0–47.0)
Hemoglobin: 8.9 g/dL — ABNORMAL LOW (ref 12.0–16.0)
MCH: 30.3 pg (ref 26.0–34.0)
MCHC: 32.9 g/dL (ref 32.0–36.0)
MCV: 92.1 fL (ref 80.0–100.0)
Platelets: 164 10*3/uL (ref 150–440)
RBC: 2.92 MIL/uL — ABNORMAL LOW (ref 3.80–5.20)
RDW: 14.3 % (ref 11.5–14.5)
WBC: 24.3 10*3/uL — ABNORMAL HIGH (ref 3.6–11.0)

## 2017-04-14 LAB — GLUCOSE, CAPILLARY
GLUCOSE-CAPILLARY: 95 mg/dL (ref 65–99)
Glucose-Capillary: 112 mg/dL — ABNORMAL HIGH (ref 65–99)
Glucose-Capillary: 140 mg/dL — ABNORMAL HIGH (ref 65–99)
Glucose-Capillary: 166 mg/dL — ABNORMAL HIGH (ref 65–99)

## 2017-04-14 LAB — CK
CK TOTAL: 843 U/L — AB (ref 38–234)
CK TOTAL: 801 U/L — AB (ref 38–234)
Total CK: 867 U/L — ABNORMAL HIGH (ref 38–234)

## 2017-04-14 NOTE — Procedures (Addendum)
SURGICAL OPERATIVE REPORT  DATE OF PROCEDURE: 04/14/2017  ATTENDING: Dr. Marilynne Drivers. Rosana Hoes, MD  ANESTHESIA: Local  PRE-OPERATIVE DIAGNOSIS: Unstagable large sacrococcygeal and bilateral buttock pressure necrosis wound (L89.150)  POST-OPERATIVE DIAGNOSIS: Stage 3 - 4 large sacrococcygeal and bilateral upper buttocks pressure necrosis wound (L89.153)  PROCEDURE(S):  1.) Sharp excisional scissor debridement of 17 cm (tall) x 17 cm (wide) x 4 cm (deep) sacrococcygeal and bilateral upper buttocks pressure necrosis wound  INTRAOPERATIVE FINDINGS: 17 cm x 17 cm x 4 cm (deep) stage 3 - 4 sacrococcygeal and bilateral upper buttocks pressure necrosis wound  INTRAVENOUS FLUIDS: 0 mL crystalloid   ESTIMATED BLOOD LOSS: Minimal (< 20 mL)  URINE OUTPUT: No Foley  SPECIMENS: None  IMPLANTS: None  DRAINS: None  COMPLICATIONS: None apparent  CONDITION AT END OF PROCEDURE: Hemodynamically stable and awake  DISPOSITION OF PATIENT: PACU  INDICATIONS FOR PROCEDURE:  Patient is an 84 y.o. female who presented to [facility] ED for evaluation of her sacral pressure wound. Patient is 8 years s/p Left MCA stroke with residual Right-sided weakness and, per her youngest 75 with whom patient lives, spends many hours sitting in a chair and refuses to get up and move. Last week, while the patient's stepdaughter was off from work, she noticed darker skin over patient's buttock. The following day, she says, she called the patient's PMD, who advised patient be evaluated at an urgent care clinic. Due to a long wait, patient and her stepdaughter left the clinic and presented to [facility] ED the following day.Surgery was consulted for debridement for staging and wound optimization. All risks, benefits, and alternatives to the above procedure(s) were discussed with the patient and her family, all of patient's and family's questions were answered to their expressed satisfaction, and informed consent was obtained and  documented.  DETAILS OF PROCEDURE: In patient's bed, laying with her Right side down, the operative site was prepped and draped in the usual sterile fashion, using betadine due to patient's open wound, and following a brief time out, forceps were used to retract the overlying eschar of first the larger Left pressure necrosis wound, while scissors were used to excise this outer layer of necrotic skin. Underneath was found to be very foul-smelling necrotic subcutaneous fat and muscle without any pus or evidence of abscess. Such dissection was continued until a small amount of bleeding was encountered, though there still remained some additional necrotic tissue.   The same was then performed for patient's Right sacro-coccygeal and buttock wound, after which both sides were re-examined and additional sharp excisional dissection was performed. The initial measurements of these two wounds were individually Left: 17 cm (tall) x 9 cm (wide) x 4 cm (deep) and Right: 16 cm (tall) x 8 cm (wide) x 2.5 cm (deep). However, the bridge of sacro-coccygeal tissue between the two wounds was also found to be necrotic, and this too was then sharply debrided and excised using forceps and scissors. Upon completion, there remained just a thin layer of tissue overlying patient's sacrum and coccyx, and this remaining thin layer also appeared necrotic. However, due to patient's discomfort secondary to her positioning and not wanting to expose bone if any viable tissue remained over patient's bone, decision was made to complete the procedure at this time. Moist to dry gauze dressings and ABD pads were applied, taking care to minimize any wrinkles in material or pressure points otherwise, and patient was repositioned more comfortably in her bed.  I was present for all aspects of the  above procedure, and there were no complications apparent.

## 2017-04-14 NOTE — Progress Notes (Signed)
Debridement of stage 3, possibly stage 4, sacrococcygeal-buttock (bilateral) pressure necrosis wound debrided this afternoon. Procedural report and measurements to follow.  -- Marilynne Drivers Rosana Hoes, MD, [place]: [facility] - Partnering for exceptional care. Office: [phone]

## 2017-04-14 NOTE — Clinical Social Work Note (Signed)
Clinical Social Work Assessment  Patient Details  Name: Kirsten Beck MRN: [id number] Date of Birth: [birth year]  Date of referral:  04/14/17               Reason for consult:  Discharge Planning                Permission sought to share information with:  Family Supports Permission granted to share information::  Yes, Verbal Permission Granted  Name::        Agency::     Relationship::     Contact Information:     Housing/Transportation Living arrangements for the past 2 months:  Single Family Home Source of Information:  (step daughter) Patient Interpreter Needed:  None Criminal Activity/Legal Involvement Pertinent to Current Situation/Hospitalization:  No - Comment as needed Significant Relationships:  Adult Children Lives with:  (Will be living with step daughter at discharge) Do you feel safe going back to the place where you live?  No Need for family participation in patient care:  No (Coment)  Care giving concerns:  Patient will require 24/7 care at home.   Social Worker assessment / plan:  CSW asked by MD to speak with patient's step daughter to see if she wishes to pursue SNF for her mom due to this being the recommendation by PT. Palliative Care documentation expressed that patient's daughter believes if patient were to be placed in a nursing facility that she would not get good, quality care. PT informed CSW that they had recommended SNF due to patient having been independent and driving prior to this hospitalization. Currently patient cannot be moved without yelling out in pain and thus cannot participate with PT.   CSW spoke with patient's stepdaughter in patient's room this afternoon. [name] stated that she will plan on taking patient back home and will want home health for nursing for dressing changes and to teach her how to do the dressing changes. RN CM will arrange this. CSW has updated MD.  Employment status:  Retired Forensic scientist:   Medicare PT Recommendations:  [place] / Referral to community resources:     Patient/Family's Response to care:  [name] expressed appreciation for CSW visit.  Patient/Family's Understanding of and Emotional Response to Diagnosis, Current Treatment, and Prognosis:  [name] is only wanting the best for her stepmother and does not believe that she will get this in a SNF.  Emotional Assessment Appearance:  Appears stated age Attitude/Demeanor/Rapport:  (quiet but will attempt to speak) Affect (typically observed):  Calm Orientation:  Oriented to Self, Oriented to Place Alcohol / Substance use:  Not Applicable Psych involvement (Current and /or in the community):  No (Comment)  Discharge Needs  Concerns to be addressed:  Care Coordination Readmission within the last 30 days:  No Current discharge risk:  None Barriers to Discharge:  No Barriers Identified   Shela Leff, LCSW 04/14/2017, 3:35 PM

## 2017-04-14 NOTE — Progress Notes (Signed)
[place] at [place] NAME: Kirsten Beck    MR#:  [id number]  DATE OF BIRTH:  [birth year]  SUBJECTIVE:  No new symptoms, waiting for debridement today REVIEW OF SYSTEMS:   Review of Systems  Unable to perform ROS: Patient nonverbal   Tolerating Diet:yes Tolerating PT: pending  DRUG ALLERGIES:   Allergies  Allergen Reactions  . Peanut Butter Flavor     VITALS:  Blood pressure 91/63, pulse 78, temperature 98.8 F (37.1 C), temperature source Oral, resp. rate 20, height 5\' 3"  (1.6 m), weight 74.4 kg (164 lb), SpO2 100 %.  PHYSICAL EXAMINATION:   Physical Exam  GENERAL:  84 y.o.-year-old patient lying in the bed with no acute distress.  Appears chronically ill EYES: Pupils equal, round, reactive to light and accommodation. No scleral icterus. Extraocular muscles intact.  HEENT: Head atraumatic, normocephalic. Oropharynx and nasopharynx clear.  NECK:  Supple, no jugular venous distention. No thyroid enlargement, no tenderness.  LUNGS: Normal breath sounds bilaterally, no wheezing, rales, rhonchi. No use of accessory muscles of respiration.  CARDIOVASCULAR: S1, S2 normal. No murmurs, rubs, or gallops.  ABDOMEN: Soft, nontender, nondistended. Bowel sounds present. No organomegaly or mass.  EXTREMITIES: No cyanosis, clubbing or edema b/l.    NEUROLOGIC: Unable to assess  pSYCHIATRIC:  patient is alert SKIN: Large sacral ulcer about stage III/with black necrotic skin surrounding and significant amount of discharge present Tibial shin ulcers present on both bilateral lower extremity--foam dressing present LABORATORY PANEL:  CBC Recent Labs  Lab 04/14/17 0544  WBC 24.3*  HGB 8.9*  HCT 26.9*  PLT 164    Chemistries  Recent Labs  Lab 04/11/17 1551  04/13/17 0502  NA 145   < > 137  K 3.7   < > 3.5  CL 110   < > 108  CO2 25   < > 23  GLUCOSE 293*   < > 266*  BUN 43*   < > 28*  CREATININE 1.33*   < > 1.04*  CALCIUM  8.4*   < > 7.2*  AST 183*  --   --   ALT 118*  --   --   ALKPHOS 106  --   --   BILITOT 1.8*  --   --    < > = values in this interval not displayed.   Cardiac Enzymes No results for input(s): TROPONINI in the last 168 hours. RADIOLOGY:  No results found. ASSESSMENT AND PLAN:  Kirsten Beck  is a 84 y.o. female with a known history per below, noted history of left MCA infarct with seizure disorder/aphasia, brought in to the hospital by family for skin breakdown noted on her left leg and buttocks, per family the the wounds occurred over the last couple days  1 Acute extensive infected unstageable sacrococcygeal region decubitus wound with skin necrosis - pending wound cultures - empiric vancomycin/Zosyn for now - Left posterior lower leg with full thickness pressure injury, stage 3.  Findings consistent with increased bioburden and contamination. Wound care nurse recommends antimicrobial dressing.  - Wound type:pressure injury - per wound care nurse Measurement left leg:  4 cm x 3.2 cm x 0.3 cm  - Bilateral feet have full thickness maroon discoloration present, all approxiamtely 1 cm in diameter.   - general surgery planning for debridement at bedside later today  2.  Acute renal failure -Improving with hydration. likely prerenal  3. acute rhabdomyolysis Most likely secondary to immobility chronically -  continue IV fluids for rehydration, CK 1139->867 today  4 history of CVA with associated right hemiparesis/seizure disorder/aphasia Increase nursing care as needed, aspiration/fall/skin care precautions  5 chronic seizure disorder Continue Keppra and placed on seizure precautions  6  hypotension with a history of hypertension -Better with IV hydration   DNR status per patient wishes Condition stable Long-term prognosis poor   Yesterday daughter was leaning towards taking her home with home health services.  Physical therapy has evaluated the patient and is recommending  rehab.  Just discussed the case with social worker to have discussion with patient's daughter to decide discharge planning.  Regardless of where she goes she will need palliative care at discharge if she goes home she will need home health services as well.  DVT prophylaxis with heparin subcu    Case discussed with Care Management/Social Worker. Management plans discussed with the patient, family and they are in agreement.  CODE STATUS: DNR  DVT Prophylaxis: lovenox  TOTAL TIME TAKING CARE OF THIS PATIENT: *25* minutes.  >50% time spent on counselling and coordination of care  POSSIBLE D/C IN 1-2 DAYS, DEPENDING ON CLINICAL CONDITION.  Likely home with home health and palliative care to follow  Note: This dictation was prepared with Dragon dictation along with smaller phrase technology. Any transcriptional errors that result from this process are unintentional.  Max Sane M.D on 04/14/2017 at 1:24 PM  Between 7am to 6pm - Pager - [phone]  After 6pm go to [email or web address] - password [email or web address]  [facility] [place] [facility]  Office  [phone]  CC: Primary care physician; Patient, No Pcp PerPatient ID: Kirsten Beck, female   DOB: [birth year], 84 y.o.   MRN: [id number]

## 2017-04-15 LAB — BASIC METABOLIC PANEL
Anion gap: 6 (ref 5–15)
BUN: 19 mg/dL (ref 6–20)
CHLORIDE: 102 mmol/L (ref 101–111)
CO2: 25 mmol/L (ref 22–32)
CREATININE: 0.74 mg/dL (ref 0.44–1.00)
Calcium: 7.3 mg/dL — ABNORMAL LOW (ref 8.9–10.3)
GFR calc non Af Amer: >60 mL/min (ref >60)
GLUCOSE: 229 mg/dL — AB (ref 65–99)
Potassium: 3.8 mmol/L (ref 3.5–5.1)
Sodium: 133 mmol/L — ABNORMAL LOW (ref 135–145)

## 2017-04-15 LAB — GLUCOSE, CAPILLARY
Glucose-Capillary: 180 mg/dL — ABNORMAL HIGH (ref 65–99)
Glucose-Capillary: 171 mg/dL — ABNORMAL HIGH (ref 65–99)
Glucose-Capillary: 140 mg/dL — ABNORMAL HIGH (ref 65–99)

## 2017-04-15 LAB — CBC
HEMATOCRIT: 26.8 % — AB (ref 35.0–47.0)
HEMOGLOBIN: 8.8 g/dL — AB (ref 12.0–16.0)
MCH: 30 pg (ref 26.0–34.0)
MCHC: 32.9 g/dL (ref 32.0–36.0)
MCV: 91.1 fL (ref 80.0–100.0)
Platelets: 187 10*3/uL (ref 150–440)
RBC: 2.95 MIL/uL — ABNORMAL LOW (ref 3.80–5.20)
RDW: 14.1 % (ref 11.5–14.5)
WBC: 22.1 10*3/uL — ABNORMAL HIGH (ref 3.6–11.0)

## 2017-04-15 MED ORDER — AMOXICILLIN-POT CLAVULANATE 875-125 MG PO TABS: 1.0000 | ORAL_TABLET | Freq: Two times a day (BID) | ORAL | 0 refills | Status: AC

## 2017-04-15 NOTE — Progress Notes (Signed)
[place] at Kirsten Beck was admitted to the [facility] on 04/11/2017 and Discharged  04/15/2017 and her step-daughter is sole caregiver so should be excused from work for 8-9 days starting 04/11/2017 , may return to work without any restrictions on 04/20/2017  Max Sane M.D on 04/15/2017,at 5:48 PM  [place] at [facility]  [phone]

## 2017-04-15 NOTE — Progress Notes (Signed)
Wet to dry dressing changed on sacral area due to soilage. Patient tolerated well. Tissue is pink in color. Saline soaked gauze applied and covered with dry ABD pads and allevyn.

## 2017-04-15 NOTE — Care Management (Addendum)
Patient suffers from chronic sacral and bilateral leg pain pain which is caused by pressure injuries.[facility] bed will alleviate pain by allowing legs and sacrum to be positioned in ways not feasible with a normal bed. Pain episodes frequently require frequent changes in body position which cannot be achieved with a normal bed.

## 2017-04-15 NOTE — Discharge Summary (Addendum)
Kirsten Beck NAME: Kirsten Beck    MR#:  [id number]  DATE OF BIRTH:  1928-11-21  DATE OF ADMISSION:  04/11/2017   ADMITTING PHYSICIAN: Gorden Harms, MD  DATE OF DISCHARGE: 04/15/2017  PRIMARY CARE PHYSICIAN: Patient, No Pcp Per   ADMISSION DIAGNOSIS:  Decubitus ulcers [L89.90] Non-traumatic rhabdomyolysis [M62.82] DISCHARGE DIAGNOSIS:  Active Problems:   Cellulitis   Pressure injury of skin   Non-traumatic rhabdomyolysis   Sacral wound   History of ischemic left MCA stroke   Palliative care encounter  SECONDARY DIAGNOSIS:   Past Medical History:  Diagnosis Date  . Breast cancer ([place])   . CVA (cerebral vascular accident) ([place])   . Hyperlipidemia   . Hypertension   . Seizure disorder [facility])    HOSPITAL COURSE:  [name] a88 y.o.femalewith stage 3-4 sacrococcygeal and bilateral buttock pressure necrosis wound(s) 1 day associated with impaired mobility 8 years s/p Left MCA stroke with persistent/residual Right-sided weakness, complicated by pertinent comorbidities includingHTN, HLD, post-CVA seizure disorder, and a history of breast cancer.  1Acute extensive infected unstageable sacrococcygeal region decubitus wound with skin necrosis - Left posterior lower leg with full thickness pressure injury, stage 3. Findings consistent with increased bioburden and contamination. Wound care nurse recommends antimicrobial dressing.  - Wound type:pressure injury - per wound care nurse Measurement left leg: 4 cm x 3.2 cm x 0.3 cm  - Bilateral feet have full thickness maroon discoloration present, all approxiamtely 1 cm in diameter.  - s/p initial staging sharp excisional debridement, stage 2 Right ankle pressure wound, and unstageable Right heel pressure wound on 04/14/2016 - pressure offloading with frequent repositioning - keep affected sites clean and dry from feces and urine - once  daily moist to dry dressing/packing changes (do not wet dressing before removing) - discussed with patient's stepdaughter concerns about patient needing more assistance than she has had at home -We are setting up home health services and palliative care to follow her at home   2.  Acute renal failure -Improving with hydration. likely prerenal  3. acute rhabdomyolysis Most likely secondary to immobility chronically -Improved with IV hydration. stop Statin   4history of CVA with associated right hemiparesis/seizure disorder/aphasia Increase nursing care as needed, aspiration/fall/skin care precautions  5chronic seizure disorder Continue Keppra  6 hypotension with a history of hypertension -Stopping all blood pressure medicine DISCHARGE CONDITIONS:  fair CONSULTS OBTAINED:  Treatment Team:  Vickie Epley, MD DRUG ALLERGIES:   Allergies  Allergen Reactions  . Peanut Butter Flavor    DISCHARGE MEDICATIONS:   Allergies as of 04/15/2017      Reactions   Peanut Butter Flavor       Medication List    STOP taking these medications   atorvastatin 40 MG tablet Commonly known as:  LIPITOR   carvedilol 3.125 MG tablet Commonly known as:  COREG   furosemide 40 MG tablet Commonly known as:  LASIX   hydrALAZINE 10 MG tablet Commonly known as:  APRESOLINE   isosorbide mononitrate 30 MG 24 hr tablet Commonly known as:  IMDUR   potassium chloride 10 MEQ tablet Commonly known as:  K-DUR,KLOR-CON     TAKE these medications   amoxicillin-clavulanate 875-125 MG tablet Commonly known as:  AUGMENTIN Take 1 tablet by mouth every 12 (twelve) hours for 7 days.   levETIRAcetam 750 MG tablet Commonly known as:  KEPPRA Take 1 tablet (750 mg total) by mouth 2 (two) times daily.  DISCHARGE INSTRUCTIONS:  - pressure offloading with frequent repositioning - keep affected sites clean and dry from feces and urine - once daily  moist to dry dressing/packing changes (do not wet dressing before removing)  DIET:  Regular diet DISCHARGE CONDITION:  Fair ACTIVITY:  Activity as tolerated OXYGEN:  Home Oxygen: No.  Oxygen Delivery: room air DISCHARGE LOCATION:  Home with home health and palliative care services to follow transition to hospice if and when she qualifies.  If you experience worsening of your admission symptoms, develop shortness of breath, life threatening emergency, suicidal or homicidal thoughts you must seek medical attention immediately by calling 911 or calling your MD immediately  if symptoms less severe.  You Must read complete instructions/literature along with all the possible adverse reactions/side effects for all the Medicines you take and that have been prescribed to you. Take any new Medicines after you have completely understood and accpet all the possible adverse reactions/side effects.   Please note  You were cared for by a hospitalist during your hospital stay. If you have any questions about your discharge medications or the care you received while you were in the hospital after you are discharged, you can call the unit and asked to speak with the hospitalist on call if the hospitalist that took care of you is not available. Once you are discharged, your primary care physician will handle any further medical issues. Please note that NO REFILLS for any discharge medications will be authorized once you are discharged, as it is imperative that you return to your primary care physician (or establish a relationship with a primary care physician if you do not have one) for your aftercare needs so that they can reassess your need for medications and monitor your lab values.    On the day of Discharge:  VITAL SIGNS:  Blood pressure (!) 100/49, pulse 83, temperature 98.3 F (36.8 C), temperature source Oral, resp. rate (!) 24, height 5\' 3"  (1.6 m), weight 74.4 kg (164 lb), SpO2 100 %. PHYSICAL  EXAMINATION:  GENERAL:  84 y.o.-year-old patient lying in the bed with no acute distress.  EYES: Pupils equal, round, reactive to light and accommodation. No scleral icterus. Extraocular muscles intact.  HEENT: Head atraumatic, normocephalic. Oropharynx and nasopharynx clear.  NECK:  Supple, no jugular venous distention. No thyroid enlargement, no tenderness.  LUNGS: Normal breath sounds bilaterally, no wheezing, rales,rhonchi or crepitation. No use of accessory muscles of respiration.  CARDIOVASCULAR: S1, S2 normal. No murmurs, rubs, or gallops.  ABDOMEN: Soft, non-tender, non-distended. Bowel sounds present. No organomegaly or mass.  EXTREMITIES: No pedal edema, cyanosis, or clubbing.  NEUROLOGIC: Cranial nerves II through XII are intact. Muscle strength 5/5 in all extremities. Sensation intact. Gait not checked.  PSYCHIATRIC: The patient is alert and oriented x 3.  SKIN: Sacral coccygeal pressure ulcer present.  DATA REVIEW:   CBC Recent Labs  Lab 04/15/17 0431  WBC 22.1*  HGB 8.8*  HCT 26.8*  PLT 187    Chemistries  Recent Labs  Lab 04/11/17 1551  04/15/17 0431  NA 145   < > 133*  K 3.7   < > 3.8  CL 110   < > 102  CO2 25   < > 25  GLUCOSE 293*   < > 229*  BUN 43*   < > 19  CREATININE 1.33*   < > 0.74  CALCIUM 8.4*   < > 7.3*  AST 183*  --   --   ALT 118*  --   --  ALKPHOS 106  --   --   BILITOT 1.8*  --   --    < > = values in this interval not displayed.    Follow-up Information    Jodi Marble, MD. Schedule an appointment as soon as possible for a visit in 1 week(s).   Specialty:  Internal Medicine Contact information: [address] [place]  [phone]             Management plans discussed with the patient, family and they are in agreement.  CODE STATUS: DNR   TOTAL TIME TAKING CARE OF THIS PATIENT: 45 minutes.    Max Sane M.D on 04/15/2017 at 1:04 PM  Between 7am to 6pm - Pager - [phone]  After 6pm go to  [email or web address] - Proofreader  [facility]  Office  [phone]  CC: Primary care physician; Patient, No Pcp Per   Note: This dictation was prepared with Dragon dictation along with smaller phrase technology. Any transcriptional errors that result from this process are unintentional.

## 2017-04-15 NOTE — Progress Notes (Signed)
Pt discharged, transported by EMS to daughter's house. DC info, with med information, work letter for daughter and written dressing change info  Included in DC packet and taken with PT.

## 2017-04-15 NOTE — Discharge Instructions (Signed)
Change dressing to sacral area daily remove old dressing, do not wet to help with removal, clean excess drainage by patting wound with NS gauze, then saturate kerlix gauzed with normal saline and pack wound, cover with ABD pads and secure with paper tape. May place allevyn over dressing.  Change dressing to left leg daily, cleanse with normal saline, apply xeroform to wound bed ( inside wound area), cover with kerlix, ABD pad and secure with tape.   Preventing Pressure Injuries WHAT IS A PRESSURE INJURY? A pressure injury, previously called a bedsore or a pressure ulcer, is an injury to the skin and underlying tissue caused by pressure. A pressure injury can happen when your skin presses against a surface, such as a mattress or wheelchair seat, for too long. The pressure on the blood vessels causes reduced blood flow to your skin. This can eventually cause the skin tissue to die and break down into a wound. Pressure injuries usually develop:  Over bony parts of the body, such as the tailbone, shoulders, elbows, hips, and heels.  Under medical devices, such as respiratory equipment, stockings, tubes, and splints.  They can cause pain, muscle damage, and infection. HOW DO PRESSURE INJURIES HAPPEN? Pressure injuries are caused by a lack of blood supply to an area of skin. These injuries begin as a reddened area on the skin and can become an open sore. They can result from intense pressure over a short period of time or from less pressure over a long period of time. Pressure injuries can vary in severity. This condition is more likely to develop in people who:  Are in the hospital or an extended care facility.  Are bedridden or in a wheelchair.  Have an injury or disease that keeps them from: ? Moving normally. ? Feeling pain or pressure. ? Communicating if they feel pain or pressure.  Have a condition that: ? Makes them sleepy or less alert. ? Causes poor blood flow.  Need to wear a  medical device.  Have poor control of their bladder or bowel functions (incontinence).  Have poor nutrition (malnutrition).  Have had this condition before.  Are of certain ethnicities. People of African American and Latino or Hispanic descent are at higher risk compared to other ethnic groups.  HOW CAN I PREVENT PRESSURE INJURIES? Skin Care  Keep your skin clean and dry. Gently pat your skin dry.  Do not rub or massage boney areas of your skin.  Moisturize dry skin.  Use gentle cleansers and skin protectants routinely if you are incontinent.  Check your skin every day for any changes in color and for any new blisters or sores. Make sure to check under and around any medical devices and between skin folds. Have a caregiver do this for you if you are not able. Reducing and Redistributing Pressure  Do not lie or sit in one position for a long time. Move or change position every two hours, or as told by your health care provider.  Use pillows or cushions to redistribute pressure. Ask your health care provider to recommend cushions or pads for you.  Use medical devices that to not rub your skin. Tell your health care provider if one of your medical devices is causing pain or irritation. Medicines  Take over-the-counter and prescription medicines only as told by your health care provider.  If you were prescribed an antibiotic medicine, take it or apply it as told by your health care provider. Do not stop taking or using  the antibiotic even if your condition improves. General Instructions  Be as active as you can every day. Ask your health care provider to suggest safe exercises or activities.  Work with your health care provider to manage any chronic health conditions.  Eat a healthy diet that includes lots of protein. Ask your health care provider for diet advice.  Drink enough fluid to keep your urine clear or pale yellow.  Do not abuse drugs or alcohol.  Do not  smoke.  Keep all follow-up visits as told by your health care provider. This is important. WHAT STEPS WILL BE TAKEN TO PREVENT PRESSURE INJURIES IF I AM IN THE HOSPITAL? Your health care providers:  Will inspect your skin at least daily. Skin under or around medical devices should be checked at least twice a day while you are in the hospital.  May recommend that you use certain types of bedding to help prevent them. These may include a pad, mattress, or chair cushion that is filled with gel, air, water, or foam.  Will evaluate your nutrition and consult a diet specialist (dietician), if needed.  Will inspect and change any wound dressings regularly.  May help you move into different positions every few hours.  Will adjust any medical devices and braces as needed to limit pressure on your skin.  Will keep your skin clean and dry.  May use gentle cleansers and skin protectants, if you are incontinent.  Will moisturize any dry skin.  Make sure that you let your health care provider know if you feel or see any changes in your skin. This information is not intended to replace advice given to you by your health care provider. Make sure you discuss any questions you have with your health care provider. Document Released: 05/08/2004 Document Revised: 09/06/2015 Document Reviewed: 01/04/2015 Elsevier Interactive Patient Education  [employer].

## 2017-04-15 NOTE — Progress Notes (Signed)
PT Cancellation Note  Patient Details Name: Kirsten Beck MRN: [id number] DOB: Aug 08, 1928   Cancelled Treatment:    Reason Eval/Treat Not Completed: Patient declined, no reason specified. Treatment attempted; pt refused. Current discharge order.    Larae Grooms, PTA 04/15/2017, 1:49 PM

## 2017-04-15 NOTE — Progress Notes (Signed)
Patient will be discharged and transported home by EMS. Spoke with daughter Robert Bellow and gave her instruction over the phone on how to perform dressing changes to left leg and sacral wound, instruction also printed out in discharge summary. Dressing changed today already. Called Dr. Manuella Ghazi to print a note for daughter to be excused from work to care for mother, note placed in discharge information envelope. Remaining dressing supplies in patients room sent with patient as family has no supplies at home to care for wound at this time.

## 2017-04-15 NOTE — Progress Notes (Signed)
[facility] Day(s): 4.   Post op day(s):  Marland Kitchen   Interval History: Patient seen and examined, no acute events or new complaints overnight. Patient reports minimal pain and well-controlled since her debridement yesterday, denies fever/chills, N/V, CP, or SOB.  Review of Systems: Unable to assess beyond interval history due to patient's minimally verbal status  Vital signs in last 24 hours: [min-max] current  Temp:  [98.3 F (36.8 C)-98.9 F (37.2 C)] 98.3 F (36.8 C) (01/02 0630) Pulse Rate:  [78-85] 83 (01/02 0630) Resp:  [18-24] 24 (01/02 0630) BP: (91-101)/(49-63) 100/49 (01/02 0630) SpO2:  [98 %-100 %] 100 % (01/02 0630)     Height: 5\' 3"  (160 cm) Weight: 164 lb (74.4 kg) BMI (Calculated): 29.06   Intake/Output this shift:  No intake/output data recorded.   Physical Exam:  Constitutional: alert, cooperative and no distress  HENT: normocephalic without obvious abnormality  Eyes: PERRL, EOM's grossly intact and symmetric  Neuro: CN II - XII grossly intact and symmetric without deficit  Respiratory: breathing non-labored at rest  Cardiovascular: regular rate and sinus rhythm  Gastrointestinal: soft, non-tender, and non-distended Musculoskeletal: RLE weaker than LLE, 2+ B/L lower extremity pitting edema, calves do not appear tender, wounds as below  Integumentary: Mild sacral-coccygeal and B/L buttock tenderness to palpation with dressings c/d/i  Labs:  CBC Latest Ref Rng & Units 04/15/2017 04/14/2017 04/11/2017  WBC 3.6 - 11.0 K/uL 22.1(H) 24.3(H) 12.0(H)  Hemoglobin 12.0 - 16.0 g/dL 8.8(L) 8.9(L) 12.3  Hematocrit 35.0 - 47.0 % 26.8(L) 26.9(L) 37.8  Platelets 150 - 440 K/uL 187 164 234   CMP Latest Ref Rng & Units 04/15/2017 04/13/2017 04/12/2017  Glucose 65 - 99 mg/dL 229(H) 266(H) 309(H)  BUN 6 - 20 mg/dL 19 28(H) 37(H)  Creatinine 0.44 - 1.00 mg/dL 0.74 1.04(H) 1.01(H)  Sodium 135 - 145 mmol/L 133(L) 137 149(H)  Potassium 3.5 - 5.1 mmol/L 3.8 3.5  3.4(L)  Chloride 101 - 111 mmol/L 102 108 117(H)  CO2 22 - 32 mmol/L 25 23 25   Calcium 8.9 - 10.3 mg/dL 7.3(L) 7.2(L) 7.4(L)  Total Protein 6.5 - 8.1 g/dL - - -  Total Bilirubin 0.3 - 1.2 mg/dL - - -  Alkaline Phos 38 - 126 U/L - - -  AST 15 - 41 U/L - - -  ALT 14 - 54 U/L - - -   Imaging studies: No new pertinent imaging studies   Assessment/Plan: (ICD-10's: L89.150, L89.310, L89.320) 84 y.o. 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              - pressure offloading with frequent repositioning             - keep affected sites clean and dry from feces and urine             - once daily moist to dry dressing/packing changes (do not wet dressing before removing, d/w RN's)              - discussed with patient's stepdaughter concerns about patient needing more assistance than she has had at home             -  disposition (anticipate rehab +/- assisted living with palliative care, transition to hospice) per primary team             - antibiotics and medical management of comorbidities as per primary medical team             - DVT prophylaxis  All of the above findings and recommendations were discussed with the patient and her family, and all of patient's and her family's questions were answered to their expressed satisfaction.  Thank you for the opportunity to participate in this patient's care.   -- Marilynne Drivers Rosana Hoes, MD, [place]: [facility] - Partnering for exceptional care. Office: [phone]

## 2017-04-15 NOTE — Progress Notes (Signed)
New referral for out patient PALLIATIVE to follow received from [place]. Plan is for discharge today with [facility] services. Patient information faxed to referral. Flo Shanks RN, BSN, [facility] and [facility] [place], hospital Liaison [phone]

## 2017-04-15 NOTE — Care Management Note (Signed)
Case Management Note  Patient Details  Name: Kirsten Beck MRN: [id number] Date of Birth: 04-19-1928   Sharyn Lull (Patient's step daughter) has decided for patient to discharge home with home health services and outpatient palliative.  Referral was made to [facility] with [facility] and [facility] with [facility].  Hospital bed and low air loss mattress ordered from [place].  DNR and transport packet on chart.  Sharyn Lull states that she may decide to transport by private car.  RNCM notified Sharyn Lull that this would be at her discretion, however RNCM has safety concerns related to transport by private car. RNCM signing off.    Subjective/Objective:                    Action/Plan:   Expected Discharge Date:  04/15/17               Expected Discharge Plan:  [place]  In-House Referral:     Discharge planning Services  CM Consult  Post Acute Care Choice:  Home Health Choice offered to:  Adult Children  DME Arranged:  Cabin crew, Hospital bed DME Agency:  [facility] Arranged:  RN, PT, OT, Nurse's Aide, Social Work [place] Agency:  [place]  Status of Service:  Completed, signed off  If discussed at [employer] of Stay Meetings, dates discussed:    Additional Comments:  Beverly Sessions, RN 04/15/2017, 3:04 PM

## 2017-04-23 ENCOUNTER — Emergency Department
Admission: EM | Admit: 2017-04-23 | Discharge: 2017-04-24 | Disposition: A | Discharge status: Skilled Nursing Facility | Payer: Medicare Other | Attending: Emergency Medicine | Admitting: Emergency Medicine

## 2017-04-23 ENCOUNTER — Encounter: Payer: Self-pay | Admitting: Emergency Medicine

## 2017-04-23 DIAGNOSIS — Z79899 Other long term (current) drug therapy: Secondary | ICD-10-CM | POA: Insufficient documentation

## 2017-04-23 DIAGNOSIS — S31000S Unspecified open wound of lower back and pelvis without penetration into retroperitoneum, sequela: Secondary | ICD-10-CM | POA: Diagnosis not present

## 2017-04-23 DIAGNOSIS — X58XXXS Exposure to other specified factors, sequela: Secondary | ICD-10-CM | POA: Diagnosis not present

## 2017-04-23 DIAGNOSIS — Z8673 Personal history of transient ischemic attack (TIA), and cerebral infarction without residual deficits: Secondary | ICD-10-CM | POA: Diagnosis not present

## 2017-04-23 DIAGNOSIS — I10 Essential (primary) hypertension: Secondary | ICD-10-CM | POA: Insufficient documentation

## 2017-04-23 DIAGNOSIS — Z91010 Allergy to peanuts: Secondary | ICD-10-CM | POA: Insufficient documentation

## 2017-04-23 DIAGNOSIS — Z853 Personal history of malignant neoplasm of breast: Secondary | ICD-10-CM | POA: Insufficient documentation

## 2017-04-23 DIAGNOSIS — R531 Weakness: Secondary | ICD-10-CM

## 2017-04-23 LAB — URINALYSIS, COMPLETE (UACMP) WITH MICROSCOPIC
Bilirubin Urine: NEGATIVE
GLUCOSE, UA: NEGATIVE mg/dL
Ketones, ur: NEGATIVE mg/dL
Leukocytes, UA: NEGATIVE
NITRITE: NEGATIVE
Protein, ur: NEGATIVE mg/dL
SPECIFIC GRAVITY, URINE: 1.025 (ref 1.005–1.030)
pH: 5.5 (ref 5.0–8.0)

## 2017-04-23 LAB — CBC WITH DIFFERENTIAL/PLATELET
BASOS PCT: 1 %
Basophils Absolute: 0.1 10*3/uL (ref 0–0.1)
EOS ABS: 0.3 10*3/uL (ref 0–0.7)
Eosinophils Relative: 3 %
HEMATOCRIT: 29.2 % — AB (ref 35.0–47.0)
HEMOGLOBIN: 9.3 g/dL — AB (ref 12.0–16.0)
LYMPHS ABS: 1.3 10*3/uL (ref 1.0–3.6)
Lymphocytes Relative: 12 %
MCH: 29.6 pg (ref 26.0–34.0)
MCHC: 31.9 g/dL — ABNORMAL LOW (ref 32.0–36.0)
MCV: 92.9 fL (ref 80.0–100.0)
Monocytes Absolute: 0.6 10*3/uL (ref 0.2–0.9)
Monocytes Relative: 5 %
NEUTROS PCT: 79 %
Neutro Abs: 8.8 10*3/uL — ABNORMAL HIGH (ref 1.4–6.5)
Platelets: 450 10*3/uL — ABNORMAL HIGH (ref 150–440)
RBC: 3.14 MIL/uL — AB (ref 3.80–5.20)
RDW: 15.1 % — ABNORMAL HIGH (ref 11.5–14.5)
WBC: 11.1 10*3/uL — AB (ref 3.6–11.0)

## 2017-04-23 LAB — COMPREHENSIVE METABOLIC PANEL
ALT: 54 U/L (ref 14–54)
ANION GAP: 7 (ref 5–15)
AST: 49 U/L — ABNORMAL HIGH (ref 15–41)
Albumin: 2 g/dL — ABNORMAL LOW (ref 3.5–5.0)
Alkaline Phosphatase: 146 U/L — ABNORMAL HIGH (ref 38–126)
BUN: 15 mg/dL (ref 6–20)
CHLORIDE: 108 mmol/L (ref 101–111)
CO2: 24 mmol/L (ref 22–32)
CREATININE: 0.6 mg/dL (ref 0.44–1.00)
Calcium: 7.9 mg/dL — ABNORMAL LOW (ref 8.9–10.3)
GFR calc non Af Amer: >60 mL/min (ref >60)
Glucose, Bld: 185 mg/dL — ABNORMAL HIGH (ref 65–99)
Potassium: 3.6 mmol/L (ref 3.5–5.1)
SODIUM: 139 mmol/L (ref 135–145)
Total Bilirubin: 0.6 mg/dL (ref 0.3–1.2)
Total Protein: 5.8 g/dL — ABNORMAL LOW (ref 6.5–8.1)

## 2017-04-23 LAB — LACTIC ACID, PLASMA
LACTIC ACID, VENOUS: 2.4 mmol/L — AB (ref 0.5–1.9)
Lactic Acid, Venous: 2.2 mmol/L (ref 0.5–1.9)

## 2017-04-23 MED ORDER — FENTANYL CITRATE (PF) 100 MCG/2ML IJ SOLN
50.0000 ug | Freq: Once | INTRAMUSCULAR | Status: DC
  Filled 2017-04-23 (×2): qty 2

## 2017-04-23 MED ORDER — SODIUM CHLORIDE 0.9 % IV SOLN
Freq: Once | INTRAVENOUS | Status: AC
  Administered 2017-04-23: 23:00:00 via INTRAVENOUS

## 2017-04-23 MED ORDER — AMOXICILLIN-POT CLAVULANATE 875-125 MG PO TABS
1.0000 | ORAL_TABLET | Freq: Two times a day (BID) | ORAL | Status: DC
  Administered 2017-04-23 – 2017-04-24 (×2): 1 via ORAL
  Filled 2017-04-23 (×3): qty 1

## 2017-04-23 MED ORDER — LEVETIRACETAM 750 MG PO TABS
750.0000 mg | ORAL_TABLET | Freq: Two times a day (BID) | ORAL | Status: DC
  Administered 2017-04-23 – 2017-04-24 (×2): 750 mg via ORAL
  Filled 2017-04-23 (×4): qty 1

## 2017-04-23 MED ORDER — HYDROCODONE-ACETAMINOPHEN 5-325 MG PO TABS
1.0000 | ORAL_TABLET | Freq: Once | ORAL | Status: AC
  Administered 2017-04-23: 1 via ORAL
  Filled 2017-04-23: qty 1

## 2017-04-23 MED ORDER — SODIUM CHLORIDE 0.9 % IV BOLUS (SEPSIS)
1000.0000 mL | Freq: Once | INTRAVENOUS | Status: AC
  Administered 2017-04-23: 1000 mL via INTRAVENOUS

## 2017-04-23 MED ORDER — FENTANYL CITRATE (PF) 100 MCG/2ML IJ SOLN
50.0000 ug | Freq: Once | INTRAMUSCULAR | Status: AC
  Administered 2017-04-23: 50 ug via INTRAVENOUS

## 2017-04-23 NOTE — ED Notes (Signed)
Pt repositioned from weight bearing left side to weight bearing on rt side.

## 2017-04-23 NOTE — ED Notes (Signed)
Family at bedside, states " [facility] home health referred pt to ED so that pt could be admitted to wound care facility"

## 2017-04-23 NOTE — ED Notes (Addendum)
Social worker at bedside, pt daughter agrees to staying here overnight to wait for placement. Daughter now requesting to speak with MD.

## 2017-04-23 NOTE — ED Provider Notes (Signed)
[facility] Emergency Department Provider Note ____________________________________________   I have reviewed the triage vital signs and the triage nursing note.  HISTORY  Chief Complaint Wound Infection   Historian Patient  HPI Kirsten Beck is a 84 y.o. female with a deep sacral wound, in the hospital last week with debridement as well as rhabdomyolysis.  Patient was recommended for sniff, however the daughter had tried taking the patient home.  However it is proven much more difficult to take care of her at home because she has trouble moving the patient to keep pressure off the wound.  Home health wound nurse apparently recommended the patient come to the ED for likely nursing home placement.  No change in mental status.   Past Medical History:  Diagnosis Date  . Breast cancer ([place])   . CVA (cerebral vascular accident) ([place])   . Hyperlipidemia   . Hypertension   . Seizure disorder [facility])     Patient Active Problem List   Diagnosis Date Noted  . Non-traumatic rhabdomyolysis   . Sacral wound   . History of ischemic left MCA stroke   . Palliative care encounter   . Pressure injury of skin 04/12/2017  . Cellulitis 04/11/2017  . Seizure ([place]) 10/06/2016    Past Surgical History:  Procedure Laterality Date  . none      Prior to Admission medications   Medication Sig Start Date End Date Taking? Authorizing Provider  levETIRAcetam (KEPPRA) 750 MG tablet Take 1 tablet (750 mg total) by mouth 2 (two) times daily. 10/08/16   Epifanio Lesches, MD    Allergies  Allergen Reactions  . Peanut Butter Flavor     Family History  Problem Relation Age of Onset  . CAD Neg Hx   . Diabetes Neg Hx     Social History Social History   Tobacco Use  . Smoking status: Never Smoker  . Smokeless tobacco: Never Used  Substance Use Topics  . Alcohol use: No  . Drug use: No    Review of Systems  Constitutional: Negative for fever. Eyes:  Negative for visual changes. ENT: Negative for sore throat. Cardiovascular: Negative for chest pain. Respiratory: Negative for shortness of breath. Gastrointestinal: Negative for abdominal pain, vomiting and diarrhea. Genitourinary: Negative for dysuria. Musculoskeletal: Negative for back pain. Skin: Negative for rash. Neurological: Negative for headache.  ____________________________________________   PHYSICAL EXAM:  VITAL SIGNS: ED Triage Vitals  Enc Vitals Group     BP 04/23/17 1215 124/71     Pulse Rate 04/23/17 1215 80     Resp 04/23/17 1215 16     Temp 04/23/17 1215 97.7 F (36.5 C)     Temp Source 04/23/17 1215 Oral     SpO2 04/23/17 1215 97 %     Weight 04/23/17 1216 171 lb 14.4 oz (78 kg)     Height --      Head Circumference --      Peak Flow --      Pain Score --      Pain Loc --      Pain Edu? --      Excl. in [place]? --      Constitutional: Alert and oriented. Well appearing and in no distress. HEENT   Head: Normocephalic and atraumatic.      Eyes: Conjunctivae are normal. Pupils equal and round.       Ears:         Nose: No congestion/rhinnorhea.   Mouth/Throat: Mucous membranes  are moist.   Neck: No stridor. Cardiovascular/Chest: Normal rate, regular rhythm.  No murmurs, rubs, or gallops. Respiratory: Normal respiratory effort without tachypnea nor retractions. Breath sounds are clear and equal bilaterally. No wheezes/rales/rhonchi. Gastrointestinal: Soft. No distention, no guarding, no rebound. Nontender.    Genitourinary/rectal: Large deep sacral wound almost down to the sacral bone itself, however looks not newly infected right now. Musculoskeletal: Nontender with normal range of motion in all extremities. No joint effusions.  No lower extremity tenderness.  No edema. Neurologic:  Normal speech and language. No gross or focal neurologic deficits are appreciated. Skin:  Skin is warm.  Sacral wound as above.  She has ulcerations that appear to be  healing at the left lower leg as well these are tender to palpation.  No additional warmth or erythema there. Psychiatric: Patient really unable to communicate well, she does cry and scream out when touching the wound at the left leg.  Otherwise not agitated.   ____________________________________________  LABS (pertinent positives/negatives) I, Lisa Roca, MD the attending physician have reviewed the labs noted below.  Labs Reviewed  LACTIC ACID, PLASMA - Abnormal; Notable for the following components:      Result Value   Lactic Acid, Venous 2.2 (*)    All other components within normal limits  COMPREHENSIVE METABOLIC PANEL - Abnormal; Notable for the following components:   Glucose, Bld 185 (*)    Calcium 7.9 (*)    Total Protein 5.8 (*)    Albumin 2.0 (*)    AST 49 (*)    Alkaline Phosphatase 146 (*)    All other components within normal limits  CBC WITH DIFFERENTIAL/PLATELET - Abnormal; Notable for the following components:   WBC 11.1 (*)    RBC 3.14 (*)    Hemoglobin 9.3 (*)    HCT 29.2 (*)    MCHC 31.9 (*)    RDW 15.1 (*)    Platelets 450 (*)    Neutro Abs 8.8 (*)    All other components within normal limits  LACTIC ACID, PLASMA  URINALYSIS, COMPLETE (UACMP) WITH MICROSCOPIC    ____________________________________________    EKG I, Lisa Roca, MD, the attending physician have personally viewed and interpreted all ECGs.  None ____________________________________________  RADIOLOGY All Xrays were viewed by me.  Imaging interpreted by Radiologist, and I, Lisa Roca, MD the attending physician have reviewed the radiologist interpretation noted below.  None __________________________________________  PROCEDURES  Procedure(s) performed: None  Critical Care performed: None   ____________________________________________  ED COURSE / ASSESSMENT AND PLAN  Pertinent labs & imaging results that were available during my care of the patient were reviewed  by me and considered in my medical decision making (see chart for details).    Daughter having trouble caring for the patient at home with her large sacral wound, she was recommended for a sniff after qualifying stay about a week ago, I will consult PT and social work for likely nursing home placement.  Her physical exam and laboratory studies do not indicate a new worsening condition for which she would need hospitalization to my evaluation right now.  Her lactate was a little elevated, she is getting IV fluids.  Urinalysis is pending.  Patient care transferred to Dr. Archie Balboa at shift change 3:30 PM.  Awaiting social work and PT disposition.  DIFFERENTIAL DIAGNOSIS: Including but not limited to sepsis, wound infection, dehydration, electro light disturbance, etc.  CONSULTATIONS: Social work and PT.  Patient / Family / Caregiver informed of clinical course,  medical decision-making process, and agree with plan.    ___________________________________________   FINAL CLINICAL IMPRESSION(S) / ED DIAGNOSES   Final diagnoses:  Sacral wound, sequela  Generalized weakness      ___________________________________________        Note: This dictation was prepared with Dragon dictation. Any transcriptional errors that result from this process are unintentional    Lisa Roca, MD 04/23/17 1535

## 2017-04-23 NOTE — Clinical Social Work Note (Addendum)
CSW met with pt and daughter-Michelle Evans at bedside to address social work consult for "needs nh placement." CSW introduced self and explained Social Work role. Pt lives at home with her daughter, who provides care for pt. Pt was recently discharge home with home health on 04/15/17, after an inpatient stay. Per daughter, [facility] informed pt to come back to the hospital to be placed in an [facility]. CSW explained LTACH criteria for admission. CSW explained pt has a 3-night qualifying stay within the last 30 days that would qualify pt for short term rehab at a SNF. CSW discussed SNF placement for wound care. Pt and daughter reluctantly agreeable to SNF placement. CSW explained process and pt agreeable to fax out. CSW completed FL-2 and sent out to [facility] and [employer]. Pt PASRR is presenting an error so CSW will call [facility] in the morning to fix. CSW awaiting offers and will provide when available. Pt and daughter are aware that pt will stay overnight, as no SNF offers were made. CSW continuing to follow for discharge needs.   Oretha Ellis, Latanya Presser, [place] Social Worker-ED [phone]

## 2017-04-23 NOTE — ED Triage Notes (Addendum)
Pt to ED via EMS from home with c/o worsening infection with pressure ulcers. Pt was recently hospitalized with pressure ulcers and debriding procedure to sacrum ulcer and to follow up with wound care. Pt lives at home with family, family states pt is at baseline cognition, pt is nonverbal. VS stable.

## 2017-04-23 NOTE — ED Notes (Addendum)
Pt changed of urine at this time, pt was saturated with urine . Pt cleaned and dried at this time. PT repositioned, wound dressed and packed with clean wet gauze per MD. Urine wick placed on pt at this time

## 2017-04-23 NOTE — NC FL2 (Signed)
  [place] LEVEL OF CARE SCREENING TOOL     IDENTIFICATION  Patient Name: Kirsten Beck Birthdate: 1928/04/23 Sex: female Admission Date (Current Location): 04/23/2017  [place] and [place] Number:  Engineering geologist and Address:  [facility], [address], [place],  [address]      Provider Number: [id number]  Attending Physician Name and Address:  Lisa Roca, MD  Relative Name and Phone Number:  Daughter- Robert Bellow [phone]    Current Level of Care: Hospital Recommended Level of Care: [place] Prior Approval Number:    Date Approved/Denied:   PASRR Number:  [id number]   Discharge Plan: SNF    Current Diagnoses: Patient Active Problem List   Diagnosis Date Noted  . Non-traumatic rhabdomyolysis   . Sacral wound   . History of ischemic left MCA stroke   . Palliative care encounter   . Pressure injury of skin 04/12/2017  . Cellulitis 04/11/2017  . Seizure ([place]) 10/06/2016    Orientation RESPIRATION BLADDER Height & Weight     Self  Normal Incontinent Weight: 171 lb 14.4 oz (78 kg) Height:     BEHAVIORAL SYMPTOMS/MOOD NEUROLOGICAL BOWEL NUTRITION STATUS      Incontinent Diet(Low sodium diet)  AMBULATORY STATUS COMMUNICATION OF NEEDS Skin   Extensive Assist Verbally(Hard to understand) PU Stage and Appropriate Care(Unstageable wound: Left; lower; medial; right back) PU Stage 1 Dressing: (Left; posterior; lateral hip) PU Stage 2 Dressing: (Lower; posterior; right; mid leg)                   Personal Care Assistance Level of Assistance  Bathing, Feeding, Dressing Bathing Assistance: Maximum assistance Feeding assistance: Limited assistance Dressing Assistance: Maximum assistance     Functional Limitations Info  Sight, Hearing, Speech Sight Info: Adequate Hearing Info: Adequate Speech Info: Impaired    SPECIAL CARE FACTORS FREQUENCY  PT (By licensed PT)     PT Frequency:  5x              Contractures Contractures Info: Not present    Additional Factors Info  Code Status, Allergies Code Status Info: Full Allergies Info: Peanut Butter Flavor           Current Medications (04/23/2017):  This is the current hospital active medication list No current facility-administered medications for this encounter.    Current Outpatient Medications  Medication Sig Dispense Refill  . levETIRAcetam (KEPPRA) 750 MG tablet Take 1 tablet (750 mg total) by mouth 2 (two) times daily. 60 tablet 0     Discharge Medications: Please see discharge summary for a list of discharge medications.  Relevant Imaging Results:  Relevant Lab Results:   Additional Information SSN: [id number]  Truitt Merle, LCSW

## 2017-04-23 NOTE — ED Notes (Signed)
PT daughter states " do we have to be here all night" . RN informed family that the placement process takes time. Social worker called to bedside.

## 2017-04-23 NOTE — ED Notes (Signed)
Pt daughter has asked multiple times for a work note for last week that she has been home with her mother. This RN informed pt that we only write work notes for the date on which pt is in ED.

## 2017-04-23 NOTE — ED Notes (Signed)
Date and time results received: 04/23/17 1408 (use smartphrase ".now" to insert current time)  Test: lactic Critical Value: 2.2  Name of Provider Notified: Reita Cliche

## 2017-04-23 NOTE — ED Notes (Signed)
Fluids currently running at 700 ml/hr per doctors request

## 2017-04-23 NOTE — ED Notes (Signed)
Iv team at bedside  

## 2017-04-24 LAB — LACTIC ACID, PLASMA: Lactic Acid, Venous: 2.3 mmol/L (ref 0.5–1.9)

## 2017-04-24 MED ORDER — SODIUM CHLORIDE 0.9 % IV SOLN
Freq: Once | INTRAVENOUS | Status: AC
  Administered 2017-04-24: 04:00:00 via INTRAVENOUS

## 2017-04-24 NOTE — ED Notes (Addendum)
Lab contacted to recollect lactic acid

## 2017-04-24 NOTE — ED Notes (Signed)
Pharmacy called for keppra, not enough in pyxis for patient's ordered dose.

## 2017-04-24 NOTE — ED Notes (Signed)
PT staff at bedside.

## 2017-04-24 NOTE — Evaluation (Signed)
Physical Therapy Evaluation Patient Details Name: Kirsten Beck MRN: [id number] DOB: October 14, 1928 Today's Date: 04/24/2017   History of Present Illness  Pt is a 84 y/o F who presented with worsening infection with pressure ulcers.  Pt was recently hospitalized with pressure ulcers and debriding procedure to sacrum ulcer.  Pt's PMH includes seizure disorder, CVA, breast cancer.     Clinical Impression  Pt admitted with above diagnosis. Pt currently with functional limitations due to the deficits listed below (see PT Problem List). Kirsten Beck presents with significant pain in sacral region with any mobility.  She currently requires max assist for supine<>sit.  Unsafe at this time to attempt OOB activity as pt requires total assist to scoot to EOB and refuses to attempt OOB activity due to pain.  Pt crying out due to pain.  Per chart review the pt has been non ambulatory for the past month (confirmed by pt).  Pt likely requiring assist with most ADLs. Daughter unable to provide level of assist the pt currently requires.  Pt with no air mattress hospital bed and RW at home but does not have a WC or hoyer lift.  If the pt should refuse SNF at d/c she will need 24/7 assist and hoyer lift and WC. Given pt's current mobility status, recommending SNF at d/c. Pt will benefit from skilled PT to increase their independence and safety with mobility to allow discharge to the venue listed below.     Patient with the above dignosis which impairs her ability to perform daily activities like toileting, feeding, dressing, grooming, bathing in the home. A cane, walker, crutch will not resolve the patient's issue with performing activities of daily living. A wheelchair is required/recommended and will allow patient to safely perform daily activities.   Patient can safely propel the wheelchair in the home or has a caregiver who can provide assistance.      Follow Up Recommendations SNF    Equipment Recommendations   (hoyer lift and WC if pt does not go SNF and returns home)    Recommendations for Other Services       Precautions / Restrictions Precautions Precautions: Fall Restrictions Weight Bearing Restrictions: No      Mobility  Bed Mobility Overal bed mobility: Needs Assistance Bed Mobility: Supine to Sit;Sit to Supine     Supine to sit: Max assist Sit to supine: Max assist   General bed mobility comments: Pt requires assist for all aspects of mobility with step by step sequencing cues for supine>sit.  To return to supine the pt again requires max assist with all aspects of mobility.   Transfers                 General transfer comment: Unsafe and unable as pt crying out due to pain that she identifies as buttocks pain.  Pt refuses to attempt OOB activity despite education provided on benefits of temporary relief from buttocks.  Pt unable to achieve EOB without total assist and thus unsafe to try at this time.   Ambulation/Gait                Stairs            Wheelchair Mobility    Modified Rankin (Stroke Patients Only)       Balance Overall balance assessment: Needs assistance Sitting-balance support: Bilateral upper extremity supported;Feet unsupported Sitting balance-Leahy Scale: Poor Sitting balance - Comments: Pt requires BUE support and is able to maintain upright in sitting for ~  10 second before requiring min>mod assist to prevent posterior LOB.                                      Pertinent Vitals/Pain Pain Assessment: Faces Faces Pain Scale: Hurts whole lot Pain Location: Pt screaming out with any mobility.  Pt able to identify her buttocks as source of pain Pain Descriptors / Indicators: Moaning;Guarding(crying out) Pain Intervention(s): Limited activity within patient's tolerance;Monitored during session;Repositioned    Home Living Family/patient expects to be discharged to:: Private residence Living Arrangements:  Children Available Help at Discharge: Family Type of Home: House Home Access: Level entry     Home Layout: One level Home Equipment: Environmental consultant - 2 wheels Additional Comments: Information taken from recent admission as pt unable to provide information and no family present.     Prior Function Level of Independence: Needs assistance   Gait / Transfers Assistance Needed: Pt nods yes that she has been nonambulatory for the past month  ADL's / Homemaking Assistance Needed: Pt likely required assist with most ADLs.         Hand Dominance        Extremity/Trunk Assessment   Upper Extremity Assessment Upper Extremity Assessment: (RUE grossly 3-/5, LUE grossly 4-/5)    Lower Extremity Assessment Lower Extremity Assessment: LLE deficits/detail(RLE strength grossly 2+/5) LLE Deficits / Details: 2 wounds L ankle, strength grossly 3/5       Communication   Communication: (Pt with expressive aphasia at baseline.  Yes/no questions)  Cognition Arousal/Alertness: Awake/alert Behavior During Therapy: Anxious Overall Cognitive Status: Difficult to assess                                        General Comments General comments (skin integrity, edema, etc.): At end of session pt was repositioned into R sidelying with pillows as nurse confirmed it was time for her to be repositioned.  Pt clearly still in pain and this PT suggested to the MD that pt has an air mattress while in the hospital.  Charge nurse to place order for air mattress.     Exercises     Assessment/Plan    PT Assessment Patient needs continued PT services  PT Problem List Decreased strength;Decreased range of motion;Decreased activity tolerance;Decreased balance;Decreased mobility;Decreased knowledge of use of DME;Decreased safety awareness;Pain       PT Treatment Interventions DME instruction;Gait training;Stair training;Functional mobility training;Therapeutic activities;Therapeutic exercise;Balance  training;Neuromuscular re-education;Patient/family education;Wheelchair mobility training;Modalities    PT Goals (Current goals can be found in the Care Plan section)  Acute Rehab PT Goals Patient Stated Goal: Per chart, family unable to provide required assist and hopeful for SNF at d/c PT Goal Formulation: With patient Time For Goal Achievement: 05/08/17 Potential to Achieve Goals: Fair    Frequency Min 2X/week   Barriers to discharge Decreased caregiver support Per chart review, pt's daughter is no longer able to provide level of assist the pt currently requires    Co-evaluation               AM-PAC PT "6 Clicks" Daily Activity  Outcome Measure Difficulty turning over in bed (including adjusting bedclothes, sheets and blankets)?: Unable Difficulty moving from lying on back to sitting on the side of the bed? : Unable Difficulty sitting down on and standing up from a  chair with arms (e.g., wheelchair, bedside commode, etc,.)?: Unable Help needed moving to and from a bed to chair (including a wheelchair)?: Total Help needed walking in hospital room?: Total Help needed climbing 3-5 steps with a railing? : Total 6 Click Score: 6    End of Session   Activity Tolerance: Patient limited by pain Patient left: in bed;with call bell/phone within reach;with nursing/sitter in room Nurse Communication: Mobility status;Other (comment)(request for air mattress) PT Visit Diagnosis: Pain;Difficulty in walking, not elsewhere classified (R26.2);Muscle weakness (generalized) (M62.81);Unsteadiness on feet (R26.81) Pain - Right/Left: (central) Pain - part of body: (buttocks (sacral wound))    Time: 7591-6384 PT Time Calculation (min) (ACUTE ONLY): 17 min   Charges:   PT Evaluation $PT Eval Moderate Complexity: 1 Mod     PT G Codes:        Collie Siad PT, DPT 04/24/2017, 10:34 AM

## 2017-04-24 NOTE — ED Notes (Signed)
Pt wound changed with sterile dressing and sacral wound covering. Foul smelling yellow exudate from wound. Brief changed and linens changed. Pt awaiting EMS to come pick up patient.

## 2017-04-24 NOTE — ED Notes (Signed)
Pt turned onto left side to shift weight

## 2017-04-24 NOTE — ED Notes (Signed)
Awaiting EMS transport to [facility]. Per CSW, Bailey-daughter aware of placement. Pt aware. APS will be made aware by Mel Almond, CSW

## 2017-04-24 NOTE — ED Notes (Signed)
Pt lying in bed on back, TV turned on per request. Called dietary to request breakfast tray.

## 2017-04-24 NOTE — ED Provider Notes (Signed)
Patient was complaining of some pain.  Did write for patient got some fentanyl.   Given concern for continued dehydration given elevated lactate will give patient another liter of fluid.  Patient herself seems to be more or less at her baseline.  However I think at this point if lactic acid level does not improve patient will likely benefit from inpatient admission.   Nance Pear, MD 04/24/17 [phone]

## 2017-04-24 NOTE — ED Notes (Signed)
Pt repositioned to lie on back. Pt nods head when asked if she is comfortable.

## 2017-04-24 NOTE — Discharge Instructions (Signed)
Please seek medical attention for any high fevers, chest pain, shortness of breath, change in behavior, persistent vomiting, bloody stool or any other new or concerning symptoms.  

## 2017-04-24 NOTE — ED Provider Notes (Signed)
No results found.   Labs Reviewed  LACTIC ACID, PLASMA - Abnormal; Notable for the following components:      Result Value   Lactic Acid, Venous 2.2 (*)    All other components within normal limits  LACTIC ACID, PLASMA - Abnormal; Notable for the following components:   Lactic Acid, Venous 2.4 (*)    All other components within normal limits  COMPREHENSIVE METABOLIC PANEL - Abnormal; Notable for the following components:   Glucose, Bld 185 (*)    Calcium 7.9 (*)    Total Protein 5.8 (*)    Albumin 2.0 (*)    AST 49 (*)    Alkaline Phosphatase 146 (*)    All other components within normal limits  CBC WITH DIFFERENTIAL/PLATELET - Abnormal; Notable for the following components:   WBC 11.1 (*)    RBC 3.14 (*)    Hemoglobin 9.3 (*)    HCT 29.2 (*)    MCHC 31.9 (*)    RDW 15.1 (*)    Platelets 450 (*)    Neutro Abs 8.8 (*)    All other components within normal limits  URINALYSIS, COMPLETE (UACMP) WITH MICROSCOPIC - Abnormal; Notable for the following components:   Hgb urine dipstick TRACE (*)    Squamous Epithelial / LPF 0-5 (*)    Non Squamous Epithelial PRESENT (*)    Bacteria, UA RARE (*)    All other components within normal limits  LACTIC ACID, PLASMA - Abnormal; Notable for the following components:   Lactic Acid, Venous 2.3 (*)    All other components within normal limits  URINE CULTURE     Clinical Course as of Apr 24 821  Fri Apr 24, 2017  0010 Assuming care from Dr. Archie Balboa.  In short, Kirsten Beck is a 84 y.o. female with a chief complaint of wound infection.  Refer to the original H&P for additional details.  The current plan of care is to follow up repeat lactic acid after a liter of fluids and see if it has come down.  Clinically patient is stable and already on antibiotics.  If lactic goes up, may require admission, otherwise likely placement tomorrow.   [CF]  0310 Though the lactic acid remains slightly elevated at 2.3, it is consistent and has not gone  up although it also did not go down after 1 L of fluids.  I will continue providing some IV hydration but given that she is unchanged clinically and seems stable I do not feel that this is an indication in and of itself her hospitalization.  [CF]    Clinical Course User Index [CF] Hinda Kehr, MD     Final diagnoses:  Sacral wound, sequela  Generalized weakness      Hinda Kehr, MD 04/24/17 [phone]

## 2017-04-24 NOTE — ED Notes (Signed)
Breakfast at patient bedside; encouraged to eat. Pt refuses. Pt given apple juice, drank 8oz of apple juice.

## 2017-04-24 NOTE — ED Notes (Signed)
Pt moved into hospital bed. Clean and dry, lying on left side. Give orange juice.

## 2017-04-24 NOTE — ED Notes (Signed)
APS staff at bedside to see patient. Pt given orange juice. Refuses any food at this time. Pt sitting on right side at this time. Urinary wick in place; patient dry.

## 2017-04-24 NOTE — ED Notes (Addendum)
Linens changed, patient turned onto right side; sacral wound dressing changed.  New urine wick placed.

## 2017-04-24 NOTE — ED Notes (Signed)
Report to [facility], report to [place].

## 2017-04-24 NOTE — Progress Notes (Signed)
Patient can be placed at [facility] today because she has had a 3 night inpatient qualifying stay in the past 30 days at [facility] (04/11/17-04/15/17). Clinical Education officer, museum (CSW) verified patient's SNF level [facility] in [place] is under last name [place]. Per [facility] admissions coordinator at [facility] they can accept patient today. CSW sent signed FL2 to [facility]. RN will call report and arrange EMS for transport. CSW contacted patient's daughter Sharyn Lull and made her aware of above. Daughter is in agreement with patient going to [facility]. CSW met with patient and made her aware of above. Patient's cognitive ability is questionable and she shook her head no about going to [facility] however she shook her head yes when asked again. CSW made [facility] [employer] (APS) worker Megan aware of above. Per Jinny Blossom she went to visit patient today and she could not tell Jinny Blossom the day of the week. Per Jinny Blossom patient will need to be placed because she has no caregivers at home. Per Jinny Blossom she will follow up with patient at [facility]. Please reconsult if future social work needs arise. CSW signing off.   [employer], LCSW [phone]

## 2017-04-24 NOTE — ED Notes (Signed)
Pt left with EMS. Clean and dry. No belongings in room with patient. Pt left with her own pillow.

## 2017-04-24 NOTE — ED Notes (Signed)
ER social worker contacted, given number of APS worker who called. States she will return phone call.

## 2017-04-24 NOTE — ED Notes (Signed)
Pt repositioned to shift weight bearing locations

## 2017-04-24 NOTE — ED Notes (Signed)
Pt resting in bed, watching TV. Pt now lying on her back.

## 2017-04-24 NOTE — ED Notes (Signed)
Pt eating biscuit that this RN cut up into pieces. Pt drank 4 oz water. Took medications without difficulty.

## 2017-04-24 NOTE — ED Notes (Signed)
Airbed requested per PT recommendation. EDP aware and charge RN aware. Pt wound to left lower leg wrapped.

## 2017-04-24 NOTE — ED Notes (Signed)
[facility], [phone] requesting that social work contact them.

## 2017-04-25 LAB — URINE CULTURE
CULTURE: NO GROWTH
Special Requests: NORMAL

## 2017-04-29 NOTE — Progress Notes (Signed)
Patient has an open [facility] APS case. Clinical Social Worker (CSW) faxed APS worker Megan requested clinicals on 04/29/17.   [employer], LCSW [phone]

## 2017-04-30 DIAGNOSIS — L89153 Pressure ulcer of sacral region, stage 3: Secondary | ICD-10-CM

## 2017-05-06 ENCOUNTER — Other Ambulatory Visit
Admission: RE | Admit: 2017-05-06 | Discharge: 2017-05-06 | Disposition: A | Discharge status: Home / Self Care | Payer: Medicare Other | Source: Ambulatory Visit | Attending: Internal Medicine | Admitting: Internal Medicine

## 2017-05-06 ENCOUNTER — Encounter: Payer: No Typology Code available for payment source | Attending: Internal Medicine | Admitting: Internal Medicine

## 2017-05-06 DIAGNOSIS — L89610 Pressure ulcer of right heel, unstageable: Secondary | ICD-10-CM | POA: Diagnosis not present

## 2017-05-06 DIAGNOSIS — L97221 Non-pressure chronic ulcer of left calf limited to breakdown of skin: Secondary | ICD-10-CM | POA: Insufficient documentation

## 2017-05-06 DIAGNOSIS — Z87891 Personal history of nicotine dependence: Secondary | ICD-10-CM | POA: Insufficient documentation

## 2017-05-06 DIAGNOSIS — E43 Unspecified severe protein-calorie malnutrition: Secondary | ICD-10-CM | POA: Insufficient documentation

## 2017-05-06 DIAGNOSIS — I639 Cerebral infarction, unspecified: Secondary | ICD-10-CM | POA: Insufficient documentation

## 2017-05-06 DIAGNOSIS — I252 Old myocardial infarction: Secondary | ICD-10-CM | POA: Insufficient documentation

## 2017-05-06 DIAGNOSIS — R4701 Aphasia: Secondary | ICD-10-CM | POA: Diagnosis not present

## 2017-05-06 DIAGNOSIS — Z853 Personal history of malignant neoplasm of breast: Secondary | ICD-10-CM | POA: Insufficient documentation

## 2017-05-06 DIAGNOSIS — B999 Unspecified infectious disease: Secondary | ICD-10-CM | POA: Diagnosis present

## 2017-05-06 DIAGNOSIS — L89300 Pressure ulcer of unspecified buttock, unstageable: Secondary | ICD-10-CM | POA: Insufficient documentation

## 2017-05-06 DIAGNOSIS — L89620 Pressure ulcer of left heel, unstageable: Secondary | ICD-10-CM | POA: Diagnosis not present

## 2017-05-07 NOTE — Progress Notes (Addendum)
Kirsten Beck, Kirsten Beck ([id number]) Visit Report for 05/06/2017 Chief Complaint Document Details Patient Name: Kirsten Beck, Kirsten Beck. Date of Service: 05/06/2017 12:30 PM Medical Record Number: [id number] Patient Account Number: [id number] Date of Birth/Sex: 05/27/1928 (84 y.o. Female) Treating RN: Roger Shelter Primary Care Provider: Sena Hitch Other Clinician: Referring Provider: Sena Hitch Treating Provider/Extender: Tito Dine in Treatment: 0 Information Obtained from: Patient Chief Complaint 05/06/17; patient is here for review of an extensive pressure area over the lower sacrum and bilateral buttocks Electronic Signature(s) Signed: 05/06/2017 5:00:32 PM By: Linton Ham MD Entered By: Linton Ham on 05/06/2017 13:58:40 Reveron, Kirsten Beck ([id number]) -------------------------------------------------------------------------------- HPI Details Patient Name: Kirsten Beck. Date of Service: 05/06/2017 12:30 PM Medical Record Number: [id number] Patient Account Number: [id number] Date of Birth/Sex: April 13, 1929 (84 y.o. 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 Sometime over this timeframe she deteriorated and she developed a pressure ulcer sometime around the Christmas weekend December. This was in her lower coccyx and buttocks area. She was seen in an urgent care on 04/11/17 with rhabdomyolysis decubitus ulcer which was listed as stage III.Marland Kitchen She was back in the ER at the suggestion of home health on 04/23/17 at which time she had an extensive  sacral wound with generalized weakness. At some point during one of these states she had surgical debridement at the bedside. I have not seen him procedure note however. On 04/23/17 notable that her white count was 11.1 hemoglobin 9.3 and an albumin of 2 indicative of probable severe protein calorie malnutrition. She is now at [facility] skilled facility. She has taken's wet to dry to the necrotic areas of her extensive lower sacral and bilateral buttock wounds. She is here for review of this wound Electronic Signature(s) Signed: 05/06/2017 5:00:32 PM By: Linton Ham MD Entered By: Linton Ham on 05/06/2017 14:02:08 Kirsten Beck ([id number]) -------------------------------------------------------------------------------- Physical Exam Details Patient Name: Kirsten Beck. Date of Service: 05/06/2017 12:30 PM Medical Record Number: [id number] Patient Account Number: [id number] Date of Birth/Sex: 09-30-1928 (84 y.o. Female) Treating RN: Roger Shelter Primary Care Provider: Sena Hitch Other Clinician: Referring Provider: Sena Hitch Treating Provider/Extender: Tito Dine in Treatment: 0 Constitutional Patient is hypotensive. However she is awake and alert. Pulse regular and within target range for patient.Marland Kitchen Respirations regular, non-labored and within target range.. Temperature is normal and within the target range for the patient.. Patient looks very uncomfortable. Screams when moved. Eyes Conjunctivae clear. No discharge. Ears, Nose, Mouth, and Throat Moist mucous membranes. Respiratory Respiratory effort is easy and symmetric bilaterally. Rate is normal at rest and on room air.. Bilateral breath sounds are clear and equal in all lobes with no wheezes, rales or rhonchi.. Cardiovascular Surprisingly not overtly dehydrated.. Gastrointestinal (GI) Abdomen is soft and non-distended without masses or tenderness. Bowel sounds active in all  quadrants.. No liver or spleen enlargement or tenderness.. Genitourinary (GU) Bladder without fullness, masses or tenderness.. Integumentary (Hair, Skin) No systemic skin issues. Neurological Patient appears a phasic although she does respond to commands. Psychiatric Patient appears depressed today.. Notes Wound exam oThe patient has a very large triangular-shaped wound starting in the lower sacrum and extending into her  bilateral Buttocks. On the right side of the wound extensive necrotic material with purulent malodorous drainage. There was no soft tissue issues around the wound no soft tissue crepitus oOn the left hand side of this wound I think there is been debridement that his tissue actually looked somewhat healthier. oShe has bilateral deep tissue injuries on both heels oSuperficial area on the left posterior calf Electronic Signature(s) Signed: 05/06/2017 5:00:32 PM By: Linton Ham MD Entered By: Linton Ham on 05/06/2017 14:05:21 Kirsten Beck ([id number]) -------------------------------------------------------------------------------- Physician Orders Details Patient Name: Kirsten Beck. Date of Service: 05/06/2017 12:30 PM Medical Record Number: [id number] Patient Account Number: [id number] Date of Birth/Sex: [birth year] (84 y.o. Female) Treating RN: Roger Shelter Primary Care Provider: Sena Hitch Other Clinician: Referring Provider: Sena Hitch Treating Provider/Extender: Tito Dine in Treatment: 0 Verbal / Phone Orders: No Diagnosis Coding ICD-10 Coding Code Description L89.300 Pressure ulcer of unspecified buttock, unstageable L89.610 Pressure ulcer of right heel, unstageable L89.620 Pressure ulcer of left heel, unstageable L97.221 Non-pressure chronic ulcer of left calf limited to breakdown of skin E43 Unspecified severe protein-calorie malnutrition Wound Cleansing Wound #1 Left Lower Leg o Clean wound with Normal  Saline. Wound #2 Sacrum o Clean wound with Normal Saline. Primary Wound Dressing Wound #1 Left Lower Leg o Silvercel Non-Adherent Wound #2 Sacrum o Other: - dakins saturated gauze Secondary Dressing Wound #1 Left Lower Leg o Other - kerlix wrap. Wound #2 Sacrum o Other - foam pads and abd pads with tape Dressing Change Frequency Wound #1 Left Lower Leg o Change dressing every day. Wound #2 Sacrum o Change dressing every day. Follow-up Appointments Wound #1 Left Lower Leg o Other: - daughterSharyn Lull to follow up with phone call for additional appointments Wound #2 Sacrum o Other: - daughter- Sharyn Lull to follow up with phone call for additional appointments Kirsten Beck, Kirsten Beck ([id number]) Laboratory o Bacteria identified in Wound by Culture (MICRO) - Sacrum - (ICD10 L89.300 - Pressure ulcer of unspecified buttock, unstageable) oooo LOINC Code: 9892-1 oooo Convenience Name: Wound culture routine Electronic Signature(s) Signed: 05/06/2017 5:00:32 PM By: Linton Ham MD Signed: 05/07/2017 8:17:56 AM By: Gretta Cool, BSN, RN, CWS, Kim RN, BSN Previous Signature: 05/06/2017 2:23:03 PM Version By: Roger Shelter Entered By: Gretta Cool BSN, RN, CWS, Kim on 05/06/2017 14:57:02 Kirsten Beck, Kirsten Beck ([id number]) -------------------------------------------------------------------------------- Problem List Details Patient Name: BREELEY, BISCHOF. Date of Service: 05/06/2017 12:30 PM Medical Record Number: [id number] Patient Account Number: [id number] Date of Birth/Sex: [birth year] (84 y.o. Female) Treating RN: Roger Shelter Primary Care Provider: Sena Hitch Other Clinician: Referring Provider: Sena Hitch Treating Provider/Extender: Tito Dine in Treatment: 0 Active Problems ICD-10 Encounter Code Description Active Date Diagnosis L89.300 Pressure ulcer of unspecified buttock, unstageable 05/06/2017 Yes L89.610 Pressure ulcer of right heel,  unstageable 05/06/2017 Yes L89.620 Pressure ulcer of left heel, unstageable 05/06/2017 Yes L97.221 Non-pressure chronic ulcer of left calf limited to breakdown of skin 05/06/2017 Yes E43 Unspecified severe protein-calorie malnutrition 05/06/2017 Yes Inactive Problems Resolved Problems Electronic Signature(s) Signed: 05/06/2017 2:24:54 PM By: Roger Shelter Signed: 05/06/2017 5:00:32 PM By: Linton Ham MD Entered By: Roger Shelter on 05/06/2017 14:24:54 Kirsten Beck ([id number]) -------------------------------------------------------------------------------- Progress Note Details Patient Name: Kirsten Beck. Date of Service: 05/06/2017 12:30 PM Medical Record Number: [id number] Patient Account Number: [id number] Date of Birth/Sex: 10/17/1928 (84 y.o. 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 Sometime over this timeframe she deteriorated and she developed a pressure ulcer sometime around the Christmas weekend December. This was in her lower coccyx and buttocks area. She was seen in an urgent care on 04/11/17 with rhabdomyolysis decubitus ulcer which was listed as stage III.Marland Kitchen She was back in the ER at the suggestion of home health on 04/23/17 at which time she had an extensive sacral wound with generalized weakness. At some point during one of these states she had surgical debridement at the bedside. I  have not seen him procedure note however. On 04/23/17 notable that her white count was 11.1 hemoglobin 9.3 and an albumin of 2 indicative of probable severe protein calorie malnutrition. She is now at [facility] skilled facility. She has taken's wet to dry to the necrotic areas of her extensive lower sacral and bilateral buttock wounds. She is here for review of this wound Wound History Patient reportedly has not tested positive for osteomyelitis. Patient reportedly has not had testing performed to evaluate circulation in the legs. Patient experiences the following problems associated with their wounds: infection. Patient History Unable to Obtain Patient History due to Aphasia. Information obtained from Caregiver. Allergies peanut butter flavor Social History Former smoker, Marital Status - Widowed, Alcohol Use - Never, Drug Use - No History, Caffeine Use - Never. Medical History Eyes Denies history of Cataracts, Glaucoma, Optic Neuritis Hematologic/Lymphatic Patient has history of Lymphedema Denies history of Anemia, Hemophilia, Human Immunodeficiency Virus, Sickle Cell Disease Respiratory Denies history of Aspiration, Asthma, Chronic Obstructive Pulmonary Disease (COPD), Pneumothorax, Sleep Apnea, Tuberculosis Cardiovascular Patient has history of Myocardial Infarction Denies history of Arrhythmia, Congestive Heart Failure, Coronary Artery Disease, Deep Vein Thrombosis, Hypertension, Hypotension, Peripheral Arterial Disease, Peripheral Venous Disease, Phlebitis, Vasculitis Hinsley, Kirsten Beck ([id number]) Gastrointestinal Denies history of Cirrhosis , Colitis, Crohn s, Hepatitis A, Hepatitis B, Hepatitis C Endocrine Denies history of Type I Diabetes, Type II Diabetes Genitourinary Denies history of End Stage Renal Disease Immunological Denies history of Lupus Erythematosus, Raynaud s, Scleroderma Integumentary (Skin) Patient has history of History of pressure  wounds Denies history of History of Burn Musculoskeletal Denies history of Gout, Rheumatoid Arthritis, Osteoarthritis, Osteomyelitis Neurologic Denies history of Neuropathy Oncologic Patient has history of Received Chemotherapy, Received Radiation Psychiatric Denies history of Anorexia/bulimia, Confinement Anxiety Medical And Surgical History Notes Neurologic 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 Oncologic breast cancer Review of Systems (ROS) Constitutional Symptoms (General Health) Complains or has symptoms of Fever. Denies complaints or symptoms of Fatigue, Chills, Marked Weight Change. Hematologic/Lymphatic Denies complaints or symptoms of Bleeding / Clotting Disorders, Human Immunodeficiency Virus. Respiratory Denies complaints or symptoms of Chronic or frequent coughs, Shortness of Breath. Cardiovascular stroke with right sided weakness and aphasia Gastrointestinal Denies complaints or symptoms of Frequent diarrhea, Nausea, Vomiting. Endocrine Denies complaints or symptoms of Hepatitis, Thyroid disease, Polydypsia (Excessive Thirst). Genitourinary Denies complaints or symptoms of Kidney failure/ Dialysis, Incontinence/dribbling. Immunological Denies complaints or symptoms of Hives, Itching. Integumentary (Skin) Complains or has symptoms of Wounds, Bleeding or bruising tendency, Breakdown. Musculoskeletal Complains or has symptoms of Muscle Pain, Muscle Weakness. Psychiatric Complains or has symptoms of Anxiety. Denies complaints or symptoms of  Claustrophobia. Kirsten Beck, WEIAND. ([id number]) Objective Constitutional Patient is hypotensive. However she is awake and alert. Pulse regular and within target range for patient.Marland Kitchen Respirations regular, non-labored and within target range.. Temperature is normal and within the target range for the patient.. Patient looks very uncomfortable. Screams when moved. Vitals Time Taken: 12:46 PM, Height: 64 in, Source: Stated,  Weight: 160 lbs, Source: Stated, BMI: 27.5, Temperature: 99.4  F, Pulse: 88 bpm, Respiratory Rate: 24 breaths/min, Blood Pressure: 96/57 mmHg. Eyes Conjunctivae clear. No discharge. Ears, Nose, Mouth, and Throat Moist mucous membranes. Respiratory Respiratory effort is easy and symmetric bilaterally. Rate is normal at rest and on room air.. Bilateral breath sounds are clear and equal in all lobes with no wheezes, rales or rhonchi.. Cardiovascular Surprisingly not overtly dehydrated.. Gastrointestinal (GI) Abdomen is soft and non-distended without masses or tenderness. Bowel sounds active in all quadrants.. No liver or spleen enlargement or tenderness.. Genitourinary (GU) Bladder without fullness, masses or tenderness.. Neurological Patient appears a phasic although she does respond to commands. Psychiatric Patient appears depressed today.. General Notes: Wound exam The patient has a very large triangular-shaped wound starting in the lower sacrum and extending into her bilateral Buttocks. On the right side of the wound extensive necrotic material with purulent malodorous drainage. There was no soft tissue issues around the wound no soft tissue crepitus On the left hand side of this wound I think there is been debridement that his tissue actually looked somewhat healthier. She has bilateral deep tissue injuries on both heels Superficial area on the left posterior calf Integumentary (Hair, Skin) No systemic skin issues. Wound #1 status is Open. Original cause of wound was Pressure Injury. The wound is located on the Left Lower Leg. The wound measures 3cm length x 5cm width x 0.1cm depth; 11.781cm^2 area and 1.178cm^3 volume. There is no tunneling or undermining noted. There is a large amount of serosanguineous drainage noted. Foul odor after cleansing was noted. The wound margin is distinct with the outline attached to the wound base. There is large (67-100%) red granulation within  the wound bed. There is a small (1-33%) amount of necrotic tissue within the wound bed including Adherent Slough. The periwound skin appearance exhibited: Excoriation, Induration. The periwound skin appearance did not exhibit: Callus, Crepitus, Rash, Scarring, Dry/Scaly, Maceration, Atrophie Blanche, Cyanosis, Ecchymosis, Hemosiderin Staining, Mottled, Pallor, Rubor, Erythema. Periwound temperature was noted as No Abnormality. The periwound has tenderness on palpation. Wound #2 status is Open. Original cause of wound was Pressure Injury. The wound is located on the Sacrum. The wound measures 16cm length x 15.5cm width x 5cm depth; 194.779cm^2 area and 973.894cm^3 volume. There is muscle and Fat Kirsten Beck, Kirsten M. ([id number]) Layer (Subcutaneous Tissue) Exposed exposed. There is a large amount of serosanguineous drainage noted. Foul odor after cleansing was noted. The wound margin is indistinct and nonvisible. There is small (1-33%) red, friable granulation within the wound bed. There is a large (67-100%) amount of necrotic tissue within the wound bed including Adherent Slough. The periwound skin appearance exhibited: Excoriation, Induration. The periwound skin appearance did not exhibit: Callus, Crepitus, Rash, Scarring, Dry/Scaly, Maceration, Atrophie Blanche, Cyanosis, Ecchymosis, Hemosiderin Staining, Mottled, Pallor, Rubor, Erythema. Periwound temperature was noted as No Abnormality. The periwound has tenderness on palpation. General Notes: foul dark amber drainage noted dripping from wound on right gluteal area Other Condition(s) Patient presents with Suspected Deep Tissue Injury located on the Right heel. The skin appearance exhibited: Callus, Excoriation. The skin appearance did not exhibit: Blane Ohara,  Crepitus, Cyanosis, Dry/Scaly, Ecchymosis, Erythema, Friable, Hemosiderin Staining, Induration, Maceration, Mottled, Pallor, Rash, Rubor, Scarring. Skin temperature was noted as No  Abnormality. There is tenderness on palpation. Patient presents with Suspected Deep Tissue Injury located on the Left heel. The skin appearance exhibited: Callus, Excoriation, Friable. The skin appearance did not exhibit: Atrophie Blanche, Crepitus, Cyanosis, Dry/Scaly, Ecchymosis, Erythema, Hemosiderin Staining, Induration, Maceration, Mottled, Pallor, Rash, Rubor, Scarring. Skin temperature was noted as No Abnormality. There is tenderness on palpation. Assessment Active Problems ICD-10 L89.300 - Pressure ulcer of unspecified buttock, unstageable L89.610 - Pressure ulcer of right heel, unstageable L89.620 - Pressure ulcer of left heel, unstageable L97.221 - Non-pressure chronic ulcer of left calf limited to breakdown of skin E43 - Unspecified severe protein-calorie malnutrition Plan Wound Cleansing: Wound #1 Left Lower Leg: Clean wound with Normal Saline. Wound #2 Sacrum: Clean wound with Normal Saline. Primary Wound Dressing: Wound #1 Left Lower Leg: Silvercel Non-Adherent Wound #2 Sacrum: Other: - dakins saturated gauze Secondary Dressing: Wound #1 Left Lower Leg: Other - kerlix wrap. Wound #2 Sacrum: Other - foam pads and abd pads with tape Dressing Change Frequency: Wound #1 Left Lower Leg: Change dressing every day. Kirsten Beck, Kirsten M. ([id number]) Wound #2 Sacrum: Change dressing every day. Follow-up Appointments: Wound #1 Left Lower Leg: Other: - daughter- Sharyn Lull to follow up with phone call for additional appointments Wound #2 Sacrum: Other: - daughter- Sharyn Lull to follow up with phone call for additional appointments Laboratory ordered were: Wound culture routine - Sacrum o #1 extensive necrotic wound starting in the lower sacrum into the bilateral buttocks. A large area of this 50-60% covered by necrotic tissue with purulent drainage. The debridement of this area would be beyond what we could safely do in this clinic she would need to go to the hospital for an  operative debridement, MRI or CT scan and then likely some method of nutritional support i.e. a PEG tube. I discussed this all in detail with the daughter who is present and another daughter on the phone #2 even with all of this I am really doubtful that this will heal and if it did it would take an extraordinarily long period of time. She appears to have a lot of pain when she moves #3 she has deep tissue injuries over the bilateral heels and an open area on the posterior left calf #4 agree with Dakin's wet-to-dry to the coccyx, bilateral heel cups with Kerlix to both heels and silver alginate to the area on the left posterior calf #5 I had an extensive ethical discussion with the daughter who is present and another daughter on the phone. I think a quite reasonable alternative to a very aggressive and difficult course for this patient would be hospice/comfort mediated care and we discussed this in considerable detail. I don't believe I am the first provider to have this discussion. Both her daughters seem to like the suggestion of hospice #6 the patient appears to have deteriorated a lot beginning of December at which time she was walking I am uncertain of the reason for this deterioration and I don't see that this is been aggressively investigated to date Electronic Signature(s) Signed: 05/11/2017 12:48:23 PM By: Gretta Cool, BSN, RN, CWS, Kim RN, BSN Signed: 05/20/2017 2:37:44 PM By: Linton Ham MD Previous Signature: 05/06/2017 2:37:26 PM Version By: Linton Ham MD Entered By: Gretta Cool, BSN, RN, CWS, Kim on 05/11/2017 12:48:23 Sundae, Maners Kirsten Beck ([id number]) -------------------------------------------------------------------------------- ROS/PFSH Details Patient Name: Kirsten Beck, Kirsten Beck. Date of Service: 05/06/2017 12:30 PM Medical  Record Number: [id number] Patient Account Number: [id number] Date of Birth/Sex: 05-Dec-1928 (84 y.o. Female) Treating RN: Roger Shelter Primary Care Provider:  Sena Hitch Other Clinician: Referring Provider: Sena Hitch Treating Provider/Extender: Tito Dine in Treatment: 0 Unable to Obtain Patient History due to oo Aphasia Information Obtained From Caregiver Wound History Do you currently have one or more open woundso Yes Approximately how long have you had your woundso 04/12/2017 How have you been treating your wound(s) until nowo solosite cream Has your wound(s) ever healed and then re-openedo No Have you had any lab work done in the past montho Yes Have you tested positive for an antibiotic resistant organism (MRSA, VRE)o No Have you tested positive for osteomyelitis (bone infection)o No Have you had any tests for circulation on your legso No Have you had other problems associated with your woundso Infection Constitutional Symptoms (General Health) Complaints and Symptoms: Positive for: Fever Negative for: Fatigue; Chills; Marked Weight Change Hematologic/Lymphatic Complaints and Symptoms: Negative for: Bleeding / Clotting Disorders; Human Immunodeficiency Virus Medical History: Positive for: Lymphedema Negative for: Anemia; Hemophilia; Human Immunodeficiency Virus; Sickle Cell Disease Respiratory Complaints and Symptoms: Negative for: Chronic or frequent coughs; Shortness of Breath Medical History: Negative for: Aspiration; Asthma; Chronic Obstructive Pulmonary Disease (COPD); Pneumothorax; Sleep Apnea; Tuberculosis Gastrointestinal Complaints and Symptoms: Negative for: Frequent diarrhea; Nausea; Vomiting Medical History: Negative for: Cirrhosis ; Colitis; Crohnos; Hepatitis A; Hepatitis B; Hepatitis C Endocrine Kirsten Beck, ESTORGA. ([id number]) Complaints and Symptoms: Negative for: Hepatitis; Thyroid disease; Polydypsia (Excessive Thirst) Medical History: Negative for: Type I Diabetes; Type II Diabetes Genitourinary Complaints and Symptoms: Negative for: Kidney failure/ Dialysis;  Incontinence/dribbling Medical History: Negative for: End Stage Renal Disease Immunological Complaints and Symptoms: Negative for: Hives; Itching Medical History: Negative for: Lupus Erythematosus; Raynaudos; Scleroderma Integumentary (Skin) Complaints and Symptoms: Positive for: Wounds; Bleeding or bruising tendency; Breakdown Medical History: Positive for: History of pressure wounds Negative for: History of Burn Musculoskeletal Complaints and Symptoms: Positive for: Muscle Pain; Muscle Weakness Medical History: Negative for: Gout; Rheumatoid Arthritis; Osteoarthritis; Osteomyelitis Psychiatric Complaints and Symptoms: Positive for: Anxiety Negative for: Claustrophobia Medical History: Negative for: Anorexia/bulimia; Confinement Anxiety Eyes Medical History: Negative for: Cataracts; Glaucoma; Optic Neuritis Cardiovascular Complaints and Symptoms: Review of System Notes: stroke with right sided weakness and aphasia Medical HistoryPAULA, Kirsten Beck ([id number]) Positive for: Myocardial Infarction Negative for: Arrhythmia; Congestive Heart Failure; Coronary Artery Disease; Deep Vein Thrombosis; Hypertension; Hypotension; Peripheral Arterial Disease; Peripheral Venous Disease; Phlebitis; Vasculitis Neurologic Medical History: Negative for: Neuropathy Past Medical History Notes: 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 Oncologic Medical History: Positive for: Received Chemotherapy; Received Radiation Past Medical History Notes: breast cancer Immunizations Pneumococcal Vaccine: Received Pneumococcal Vaccination: Yes Implantable Devices Family and Social History Former smoker; Marital Status - Widowed; Alcohol Use: Never; Drug Use: No History; Caffeine Use: Never; Financial Concerns: No; Food, Clothing or Shelter Needs: No; Support System Lacking: No; Transportation Concerns: No; Advanced Directives: No; Patient does not want information on Advanced Directives; Do  not resuscitate: No; Living Will: No; Medical Power of Attorney: No Electronic Signature(s) Signed: 05/06/2017 4:36:13 PM By: Roger Shelter Signed: 05/06/2017 5:00:32 PM By: Linton Ham MD Entered By: Roger Shelter on 05/06/2017 13:00:41 Kirsten Beck ([id number]) -------------------------------------------------------------------------------- SuperBill Details Patient Name: Kirsten Beck. Date of Service: 05/06/2017 Medical Record Number: [id number] Patient Account Number: [id number] Date of Birth/Sex: [birth year] (84 y.o. Female) Treating RN: Roger Shelter Primary Care Provider: Sena Hitch Other Clinician: Referring Provider: Sena Hitch Treating Provider/Extender: Tito Dine  in Treatment: 0 Diagnosis Coding ICD-10 Codes Code Description L89.300 Pressure ulcer of unspecified buttock, unstageable L89.610 Pressure ulcer of right heel, unstageable L89.620 Pressure ulcer of left heel, unstageable L97.221 Non-pressure chronic ulcer of left calf limited to breakdown of skin E43 Unspecified severe protein-calorie malnutrition Facility Procedures CPT4 Code: 32549826 Description: 41583 - WOUND CARE VISIT-LEV 4 NEW PT Modifier: Quantity: 1 Physician Procedures CPT4 Code: 0940768 Description: 08811 - WC PHYS LEVEL 4 - NEW PT ICD-10 Diagnosis Description L89.300 Pressure ulcer of unspecified buttock, unstageable L89.610 Pressure ulcer of right heel, unstageable L89.620 Pressure ulcer of left heel, unstageable Modifier: Quantity: 1 Electronic Signature(s) Signed: 05/06/2017 2:24:37 PM By: Roger Shelter Signed: 05/06/2017 5:00:32 PM By: Linton Ham MD Entered By: Roger Shelter on 05/06/2017 14:24:37

## 2017-05-07 NOTE — Progress Notes (Signed)
Kirsten Beck, Kirsten Beck ([id number]) Visit Report for 05/06/2017 Abuse/Suicide Risk Screen Details Patient Name: Kirsten Beck, Kirsten Beck. Date of Service: 05/06/2017 12:30 PM Medical Record Number: [id number] Patient Account Number: [id number] Date of Birth/Sex: [birth year] (84 y.o. Female) Treating RN: Roger Shelter Primary Care Kirsten Beck: Sena Hitch Other Clinician: Referring Fielding Mault: Sena Hitch Treating Sylar Voong/Extender: Tito Dine in Treatment: 0 Abuse/Suicide Risk Screen Items Answer ABUSE/SUICIDE RISK SCREEN: Has anyone close to you tried to hurt or harm you recentlyo No Do you feel uncomfortable with anyone in your familyo No Has anyone forced you do things that you didnot want to doo No Do you have any thoughts of harming yourselfo No Patient displays signs or symptoms of abuse and/or neglect. No Electronic Signature(s) Signed: 05/06/2017 4:36:13 PM By: Roger Shelter Entered By: Roger Shelter on 05/06/2017 13:00:59 Kirsten Beck ([id number]) -------------------------------------------------------------------------------- Activities of Daily Living Details Patient Name: Kirsten Beck, Kirsten Beck. Date of Service: 05/06/2017 12:30 PM Medical Record Number: [id number] Patient Account Number: [id number] Date of Birth/Sex: 10-07-1928 (84 y.o. Female) Treating RN: Roger Shelter Primary Care Brittan Butterbaugh: Sena Hitch Other Clinician: Referring Nanci Lakatos: Sena Hitch Treating Dollie Mayse/Extender: Tito Dine in Treatment: 0 Activities of Daily Living Items Answer Activities of Daily Living (Please select one for each item) Drive Automobile Not Able Take Medications Not Able Use Telephone Not Able Care for Appearance Not Able Use Toilet Not Able Bath / Shower Not Able Dress Self Not Able Feed Self Not Able Walk Not Able Get In / Out Bed Not Able Housework Not Able Prepare Meals Not Able Handle Money Not Able Shop for Self Not  Able Electronic Signature(s) Signed: 05/06/2017 4:36:13 PM By: Roger Shelter Entered By: Roger Shelter on 05/06/2017 13:01:47 Kirsten Beck ([id number]) -------------------------------------------------------------------------------- Education Assessment Details Patient Name: Kirsten Beck. Date of Service: 05/06/2017 12:30 PM Medical Record Number: [id number] Patient Account Number: [id number] Date of Birth/Sex: 05-26-1928 (84 y.o. Female) Treating RN: Roger Shelter Primary Care Kaige Whistler: Sena Hitch Other Clinician: Referring Keonia Pasko: Sena Hitch Treating Tahja Liao/Extender: Tito Dine in Treatment: 0 Primary Learner Assessed: Caregiver daughter and [name] Reason Patient is not Primary Learner: aphasic Learning Preferences/Education Level/Primary Language Highest Education Level: High School Preferred Language: English Cognitive Barrier Assessment/Beliefs Language Barrier: Yes Translator Needed: No Memory Deficit: No Emotional Barrier: No Cultural/Religious Beliefs Affecting Medical Care: No Physical Barrier Assessment Impaired Vision: No Impaired Hearing: No Decreased Hand dexterity: No Knowledge/Comprehension Assessment Knowledge Level: Low Comprehension Level: Low Ability to understand written Low instructions: Ability to understand verbal Low instructions: Motivation Assessment Anxiety Level: Anxious Cooperation: Cooperative Interest in Health Problems: Uninterested Perception: Confused Willingness to Engage in Self- Low Management Activities: Readiness to Engage in Self- Low Management Activities: Electronic Signature(s) Signed: 05/06/2017 4:36:13 PM By: Roger Shelter Entered By: Roger Shelter on 05/06/2017 13:03:55 Kirsten Beck ([id number]) -------------------------------------------------------------------------------- Fall Risk Assessment Details Patient Name: Kirsten Beck. Date of  Service: 05/06/2017 12:30 PM Medical Record Number: [id number] Patient Account Number: [id number] Date of Birth/Sex: 1928/07/27 (84 y.o. Female) Treating RN: Roger Shelter Primary Care Hildagard Sobecki: Sena Hitch Other Clinician: Referring Zakira Ressel: Sena Hitch Treating Sorah Falkenstein/Extender: Tito Dine in Treatment: 0 Fall Risk Assessment Items Have you had 2 or more falls in the last 12 monthso 0 No Have you had any fall that resulted in injury in the last 12 monthso 0 No FALL RISK ASSESSMENT: History of falling - immediate or within 3 months 0 No Secondary diagnosis 0 No Ambulatory aid None/bed rest/wheelchair/nurse 0 Yes Crutches/cane/walker  0 No Furniture 0 No IV Access/Saline Lock 0 No Gait/Training Normal/bed rest/immobile 0 Yes Weak 0 No Impaired 0 No Mental Status Oriented to own ability 0 No Electronic Signature(s) Signed: 05/06/2017 4:36:13 PM By: Roger Shelter Entered By: Roger Shelter on 05/06/2017 13:04:14 Kirsten Beck ([id number]) -------------------------------------------------------------------------------- Nutrition Risk Assessment Details Patient Name: Kirsten Beck. Date of Service: 05/06/2017 12:30 PM Medical Record Number: [id number] Patient Account Number: [id number] Date of Birth/Sex: Nov 26, 1928 (84 y.o. Female) Treating RN: Roger Shelter Primary Care Mehar Sagen: Sena Hitch Other Clinician: Referring Riddik Senna: Sena Hitch Treating Ondine Gemme/Extender: Tito Dine in Treatment: 0 Height (in): 64 Weight (lbs): 160 Body Mass Index (BMI): 27.5 Nutrition Risk Assessment Items NUTRITION RISK SCREEN: I have an illness or condition that made me change the kind and/or amount of 0 No food I eat I eat fewer than two meals per day 3 Yes I eat few fruits and vegetables, or milk products 0 No I have three or more drinks of beer, liquor or wine almost every day 0 No I have tooth or mouth problems that make  it hard for me to eat 0 No I don't always have enough money to buy the food I need 0 No I eat alone most of the time 0 No I take three or more different prescribed or over-the-counter drugs a day 0 No Without wanting to, I have lost or gained 10 pounds in the last six months 0 No I am not always physically able to shop, cook and/or feed myself 0 No Nutrition Protocols Good Risk Protocol Moderate Risk Protocol Electronic Signature(s) Signed: 05/06/2017 4:36:13 PM By: Roger Shelter Entered By: Roger Shelter on 05/06/2017 13:04:54

## 2017-05-08 LAB — AEROBIC CULTURE  (SUPERFICIAL SPECIMEN)

## 2017-05-08 LAB — AEROBIC CULTURE W GRAM STAIN (SUPERFICIAL SPECIMEN)

## 2017-05-08 NOTE — Progress Notes (Addendum)
Kirsten Beck ([id number]) Visit Report for 05/06/2017 Allergy List Details Patient Name: Kirsten Beck, Kirsten Beck. Date of Service: 05/06/2017 12:30 PM Medical Record Number: [id number] Patient Account Number: [id number] Date of Birth/Sex: [birth year] (84 y.o. Female) Treating RN: Roger Shelter Primary Care Ilir Mahrt: Sena Hitch Other Clinician: Referring Sophiana Milanese: Sena Hitch Treating Cameron Schwinn/Extender: Ricard Dillon Weeks in Treatment: 0 Allergies Active Allergies peanut butter flavor Allergy Notes Electronic Signature(s) Signed: 05/06/2017 4:36:13 PM By: Roger Shelter Entered By: Roger Shelter on 05/06/2017 12:48:30 Kirsten Beck ([id number]) -------------------------------------------------------------------------------- Arrival Information Details Patient Name: Kirsten Beck. Date of Service: 05/06/2017 12:30 PM Medical Record Number: [id number] Patient Account Number: [id number] Date of Birth/Sex: 16-Sep-1928 (84 y.o. Female) Treating RN: Roger Shelter Primary Care Namiyah Grantham: Sena Hitch Other Clinician: Referring Tranisha Tissue: Sena Hitch Treating Cloteal Isaacson/Extender: Tito Dine in Treatment: 0 Visit Information Patient Arrived: Stretcher Arrival Time: 12:43 Accompanied By: daughter Transfer Assistance: Stretcher Patient Identification Verified: Yes Secondary Verification Process Completed: Yes Electronic Signature(s) Signed: 05/06/2017 4:36:13 PM By: Roger Shelter Entered By: Roger Shelter on 05/06/2017 12:44:04 [facility] ([id number]) -------------------------------------------------------------------------------- Clinic Level of Care Assessment Details Patient Name: Kirsten Beck. Date of Service: 05/06/2017 12:30 PM Medical Record Number: [id number] Patient Account Number: [id number] Date of Birth/Sex: 1929/01/08 (84 y.o. Female) Treating RN: Roger Shelter Primary Care Malala Trenkamp: Sena Hitch Other  Clinician: Referring Jakylah Bassinger: Sena Hitch Treating Lupe Handley/Extender: Tito Dine in Treatment: 0 Clinic Level of Care Assessment Items TOOL 2 Quantity Score []  - Use when only an EandM is performed on the INITIAL visit 0 ASSESSMENTS - Nursing Assessment / Reassessment X - General Physical Exam (combine w/ comprehensive assessment (listed just below) when 1 20 performed on new pt. evals) X- 1 25 Comprehensive Assessment (HX, ROS, Risk Assessments, Wounds Hx, etc.) ASSESSMENTS - Wound and Skin Assessment / Reassessment []  - Simple Wound Assessment / Reassessment - one wound 0 X- 2 5 Complex Wound Assessment / Reassessment - multiple wounds []  - 0 Dermatologic / Skin Assessment (not related to wound area) ASSESSMENTS - Ostomy and/or Continence Assessment and Care []  - Incontinence Assessment and Management 0 []  - 0 Ostomy Care Assessment and Management (repouching, etc.) PROCESS - Coordination of Care []  - Simple Patient / Family Education for ongoing care 0 X- 1 20 Complex (extensive) Patient / Family Education for ongoing care []  - 0 Staff obtains Programmer, systems, Records, Test Results / Process Orders []  - 0 Staff telephones HHA, Nursing Homes / Clarify orders / etc []  - 0 Routine Transfer to another Facility (non-emergent condition) []  - 0 Routine Hospital Admission (non-emergent condition) []  - 0 New Admissions / Biomedical engineer / Ordering NPWT, Apligraf, etc. []  - 0 Emergency Hospital Admission (emergent condition) []  - 0 Simple Discharge Coordination X- 1 15 Complex (extensive) Discharge Coordination PROCESS - Special Needs []  - Pediatric / Minor Patient Management 0 []  - 0 Isolation Patient Management DENETRIA, LUEVANOS ([id number]) []  - 0 Hearing / Language / Visual special needs []  - 0 Assessment of Community assistance (transportation, D/C planning, etc.) []  - 0 Additional assistance / Altered mentation []  - 0 Support Surface(s)  Assessment (bed, cushion, seat, etc.) INTERVENTIONS - Wound Cleansing / Measurement []  - Wound Imaging (photographs - any number of wounds) 0 []  - 0 Wound Tracing (instead of photographs) []  - 0 Simple Wound Measurement - one wound X- 2 5 Complex Wound Measurement - multiple wounds []  - 0 Simple Wound Cleansing - one wound X- 2 5 Complex Wound Cleansing - multiple  wounds INTERVENTIONS - Wound Dressings []  - Small Wound Dressing one or multiple wounds 0 X- 1 15 Medium Wound Dressing one or multiple wounds X- 1 20 Large Wound Dressing one or multiple wounds []  - 0 Application of Medications - injection INTERVENTIONS - Miscellaneous []  - External ear exam 0 []  - 0 Specimen Collection (cultures, biopsies, blood, body fluids, etc.) []  - 0 Specimen(s) / Culture(s) sent or taken to Lab for analysis []  - 0 Patient Transfer (multiple staff / Civil Service fast streamer / Similar devices) []  - 0 Simple Staple / Suture removal (25 or less) []  - 0 Complex Staple / Suture removal (26 or more) []  - 0 Hypo / Hyperglycemic Management (close monitor of Blood Glucose) []  - 0 Ankle / Brachial Index (ABI) - do not check if billed separately Has the patient been seen at the hospital within the last three years: Yes Total Score: 145 Level Of Care: New/Established - Level 4 Electronic Signature(s) Signed: 05/06/2017 4:36:13 PM By: Roger Shelter Entered By: Roger Shelter on 05/06/2017 14:24:10 Kirsten Beck ([id number]) -------------------------------------------------------------------------------- Encounter Discharge Information Details Patient Name: Kirsten Beck. Date of Service: 05/06/2017 12:30 PM Medical Record Number: [id number] Patient Account Number: [id number] Date of Birth/Sex: 1929-02-21 (84 y.o. Female) Treating RN: Roger Shelter Primary Care Mihir Flanigan: Sena Hitch Other Clinician: Referring Natalye Kott: Sena Hitch Treating Neera Teng/Extender: Tito Dine in  Treatment: 0 Encounter Discharge Information Items Facility Notification Discharge Pain Level: 0 Facility Type: [facility] Discharge Condition: Stable Telephoned: No Ambulatory Status: Stretcher Orders Sent: Yes Discharge Destination: Nursing Home Transportation: Ambulance Accompanied By: daughter Schedule Follow-up Appointment: No Medication Reconciliation completed and No provided to Patient/Care Darryle Dennie: Provided on Clinical Summary of Care: 05/06/2017 Form Type Recipient Paper Patient [facility] Electronic Signature(s) Signed: 05/07/2017 11:25:55 AM By: Ruthine Dose Previous Signature: 05/06/2017 2:27:44 PM Version By: Roger Shelter Entered By: Ruthine Dose on 05/06/2017 15:17:34 Kirsten Beck ([id number]) -------------------------------------------------------------------------------- Multi Wound Chart Details Patient Name: Kirsten Beck. Date of Service: 05/06/2017 12:30 PM Medical Record Number: [id number] Patient Account Number: [id number] Date of Birth/Sex: 1929-01-02 (84 y.o. Female) Treating RN: Roger Shelter Primary Care Damonica Chopra: Sena Hitch Other Clinician: Referring Sahara Fujimoto: Sena Hitch Treating Senna Lape/Extender: Tito Dine in Treatment: 0 Vital Signs Height(in): 64 Pulse(bpm): 88 Weight(lbs): 160 Blood Pressure(mmHg): 96/57 Body Mass Index(BMI): 27 Temperature(F): 99.4 Respiratory Rate 24 (breaths/min): Photos: [N/A:N/A] Wound Location: Left Lower Leg Sacrum N/A Wounding Event: Pressure Injury Pressure Injury N/A Primary Etiology: Pressure Ulcer Pressure Ulcer N/A Comorbid History: Lymphedema, Myocardial Lymphedema, Myocardial N/A Infarction, History of pressure Infarction, History of pressure wounds, Received wounds, Received Chemotherapy, Received Chemotherapy, Received Radiation Radiation Date Acquired: 04/06/2017 04/10/2017 N/A Weeks of Treatment: 0 0 N/A Wound Status: Open Open N/A Measurements  L x W x D 3x5x0.1 16x15.5x5 N/A (cm) Area (cm) : 11.781 194.779 N/A Volume (cm) : 1.178 973.894 N/A % Reduction in Area: 0.00% 0.00% N/A % Reduction in Volume: 0.00% 0.00% N/A Classification: Category/Stage II Category/Stage IV N/A Exudate Amount: Large Large N/A Exudate Type: Serosanguineous Serosanguineous N/A Exudate Color: red, brown red, brown N/A Foul Odor After Cleansing: Yes Yes N/A Odor Anticipated Due to No No N/A Product Use: Wound Margin: Distinct, outline attached Indistinct, nonvisible N/A Granulation Amount: Large (67-100%) Small (1-33%) N/A Granulation Quality: Red Red, Friable N/A Necrotic Amount: Small (1-33%) Large (67-100%) N/A Exposed Structures: N/A Kirsten Beck ([id number]) Fascia: No Fat Layer (Subcutaneous Fat Layer (Subcutaneous Tissue) Exposed: Yes Tissue) Exposed: No Muscle: Yes Tendon: No Fascia: No  Muscle: No Tendon: No Joint: No Joint: No Bone: No Bone: No Epithelialization: None N/A N/A Periwound Skin Texture: Excoriation: Yes Excoriation: Yes N/A Induration: Yes Induration: Yes Callus: No Callus: No Crepitus: No Crepitus: No Rash: No Rash: No Scarring: No Scarring: No Periwound Skin Moisture: Maceration: No Maceration: No N/A Dry/Scaly: No Dry/Scaly: No Periwound Skin Color: Atrophie Blanche: No Atrophie Blanche: No N/A Cyanosis: No Cyanosis: No Ecchymosis: No Ecchymosis: No Erythema: No Erythema: No Hemosiderin Staining: No Hemosiderin Staining: No Mottled: No Mottled: No Pallor: No Pallor: No Rubor: No Rubor: No Temperature: No Abnormality No Abnormality N/A Tenderness on Palpation: Yes Yes N/A Wound Preparation: Ulcer Cleansing: Ulcer Cleansing: N/A Rinsed/Irrigated with Saline Rinsed/Irrigated with Saline Topical Anesthetic Applied: None Assessment Notes: N/A foul dark amber drainage N/A noted dripping from wound on right gluteal area Treatment Notes Wound #1 (Left Lower Leg) 1. Cleansed  with: Clean wound with Normal Saline 4. Dressing Applied: Other dressing (specify in notes) Notes silvercel with kerlix wrap Wound #2 (Sacrum) 1. Cleansed with: Clean wound with Normal Saline 4. Dressing Applied: Other dressing (specify in notes) Notes dakins soaked gauze with foam and abd pads to cover and secure with tape Electronic Signature(s) Signed: 05/06/2017 4:49:41 PM By: Eugenie Norrie ([id number]) Previous Signature: 05/06/2017 2:25:01 PM Version By: Roger Shelter Previous Signature: 05/06/2017 2:04:58 PM Version By: Roger Shelter Entered By: Roger Shelter on 05/06/2017 16:49:41 [facility], [facility] ([id number]) -------------------------------------------------------------------------------- [place] Details Patient Name: Kirsten Beck. Date of Service: 05/06/2017 12:30 PM Medical Record Number: [id number] Patient Account Number: [id number] Date of Birth/Sex: 1928/04/15 (84 y.o. Female) Treating RN: Roger Shelter Primary Care Alvera Tourigny: Sena Hitch Other Clinician: Referring Ahyan Kreeger: Sena Hitch Treating Olamide Carattini/Extender: Tito Dine in Treatment: 0 Active Inactive Electronic Signature(s) Signed: 05/12/2017 1:09:28 PM By: Gretta Cool, BSN, RN, CWS, Kim RN, BSN Signed: 05/12/2017 2:06:29 PM By: Roger Shelter Previous Signature: 05/06/2017 4:49:27 PM Version By: Roger Shelter Previous Signature: 05/06/2017 2:04:41 PM Version By: Roger Shelter Entered By: Gretta Cool BSN, RN, CWS, Kim on 05/12/2017 13:09:27 Georjean, Toya [facility] ([id number]) -------------------------------------------------------------------------------- Non-Wound Condition Assessment Details Patient Name: EVENY, ANASTAS. Date of Service: 05/06/2017 12:30 PM Medical Record Number: [id number] Patient Account Number: [id number] Date of Birth/Sex: Oct 29, 1928 (84 y.o. Female) Treating RN: Roger Shelter Primary Care Aviance Cooperwood: Sena Hitch Other Clinician: Referring Tico Crotteau: Sena Hitch Treating Markos Theil/Extender: Tito Dine in Treatment: 0 Non-Wound Condition: Condition: Suspected Deep Tissue Injury Location: Other: heel Side: Right Photos Periwound Skin Texture Texture Color No Abnormalities Noted: No No Abnormalities Noted: No Callus: Yes Atrophie Blanche: No Crepitus: No Cyanosis: No Excoriation: Yes Ecchymosis: No Friable: No Erythema: No Induration: No Hemosiderin Staining: No Rash: No Mottled: No Scarring: No Pallor: No Rubor: No Moisture No Abnormalities Noted: No Temperature / Pain Dry / Scaly: No Temperature: No Abnormality Maceration: No Tenderness on Palpation: Yes Electronic Signature(s) Signed: 05/06/2017 4:35:11 PM By: Roger Shelter Entered By: Roger Shelter on 05/06/2017 16:35:10 [facility], [facility] ([id number]) -------------------------------------------------------------------------------- Non-Wound Condition Assessment Details Patient Name: Kirsten Beck. Date of Service: 05/06/2017 12:30 PM Medical Record Number: [id number] Patient Account Number: [id number] Date of Birth/Sex: [birth year] (84 y.o. Female) Treating RN: Roger Shelter Primary Care Crimson Dubberly: Sena Hitch Other Clinician: Referring Annalisa Colonna: Sena Hitch Treating Teshawn Moan/Extender: Tito Dine in Treatment: 0 Non-Wound Condition: Condition: Suspected Deep Tissue Injury Location: Other: heel Side: Left Photos Periwound Skin Texture Texture Color No Abnormalities Noted: No No Abnormalities Noted: No Callus: Yes Atrophie Blanche: No Crepitus: No Cyanosis:  No Excoriation: Yes Ecchymosis: No Friable: Yes Erythema: No Induration: No Hemosiderin Staining: No Rash: No Mottled: No Scarring: No Pallor: No Rubor: No Moisture No Abnormalities Noted: No Temperature / Pain Dry / Scaly: No Temperature: No Abnormality Maceration: No Tenderness on  Palpation: Yes Electronic Signature(s) Signed: 05/06/2017 4:35:52 PM By: Roger Shelter Entered By: Roger Shelter on 05/06/2017 16:35:52 [facility], [facility] ([id number]) -------------------------------------------------------------------------------- Pain Assessment Details Patient Name: Kirsten Beck. Date of Service: 05/06/2017 12:30 PM Medical Record Number: [id number] Patient Account Number: [id number] Date of Birth/Sex: 21-Sep-1928 (84 y.o. Female) Treating RN: Roger Shelter Primary Care Dajsha Massaro: Sena Hitch Other Clinician: Referring Traniya Prichett: Sena Hitch Treating Areej Tayler/Extender: Tito Dine in Treatment: 0 Active Problems Location of Pain Severity and Description of Pain Patient Has Paino Patient Unable to Respond Site Locations Rate the pain. Current Pain Level: 10 Pain Management and Medication Current Pain Management: Notes patient has aphasia due to a stroke in past history Electronic Signature(s) Signed: 05/06/2017 4:36:13 PM By: Roger Shelter Entered By: Roger Shelter on 05/06/2017 12:50:22 Kirsten Beck ([id number]) -------------------------------------------------------------------------------- Patient/Caregiver Education Details Patient Name: Kirsten Beck. Date of Service: 05/06/2017 12:30 PM Medical Record Number: [id number] Patient Account Number: [id number] Date of Birth/Gender: October 07, 1928 (84 y.o. Female) Treating RN: Roger Shelter Primary Care Physician: Sena Hitch Other Clinician: Referring Physician: Sena Hitch Treating Physician/Extender: Tito Dine in Treatment: 0 Education Assessment Education Provided To: Patient Education Topics Provided Offloading: Handouts: What is Offloadingo Methods: Explain/Verbal Responses: State content correctly Pain: Handouts: Other: advised daughter to speak with facility MD of pain control Methods: Explain/Verbal Responses: State content  correctly Welcome To The [place]: Handouts: Welcome To [facility] [place] Methods: Explain/Verbal Responses: State content correctly Wound/Skin Impairment: Handouts: Caring for Your Ulcer Methods: Explain/Verbal Responses: State content correctly Electronic Signature(s) Signed: 05/06/2017 4:36:13 PM By: Roger Shelter Entered By: Roger Shelter on 05/06/2017 14:28:38 Kirsten Beck ([id number]) -------------------------------------------------------------------------------- Wound Assessment Details Patient Name: Kirsten Beck. Date of Service: 05/06/2017 12:30 PM Medical Record Number: [id number] Patient Account Number: [id number] Date of Birth/Sex: 1928-07-02 (84 y.o. Female) Treating RN: Roger Shelter Primary Care Myrl Bynum: Sena Hitch Other Clinician: Referring Billyjoe Go: Sena Hitch Treating Kaire Stary/Extender: Tito Dine in Treatment: 0 Wound Status Wound Number: 1 Primary Pressure Ulcer Etiology: Wound Location: Left Lower Leg Wound Open Wounding Event: Pressure Injury Status: Date Acquired: 04/06/2017 Comorbid Lymphedema, Myocardial Infarction, History of Weeks Of Treatment: 0 History: pressure wounds, Received Chemotherapy, Clustered Wound: No Received Radiation Photos Wound Measurements Length: (cm) 3 % Reduction Width: (cm) 5 % Reduction Depth: (cm) 0.1 Epitheliali Area: (cm) 11.781 Tunneling: Volume: (cm) 1.178 Underminin in Area: 0% in Volume: 0% zation: None No g: No Wound Description Classification: Category/Stage II Foul Odor A Wound Margin: Distinct, outline attached Due to Prod Exudate Amount: Large Slough/Fibr Exudate Type: Serosanguineous Exudate Color: red, brown fter Cleansing: Yes uct Use: No ino Yes Wound Bed Granulation Amount: Large (67-100%) Exposed Structure Granulation Quality: Red Fascia Exposed: No Necrotic Amount: Small (1-33%) Fat Layer (Subcutaneous Tissue) Exposed:  No Necrotic Quality: Adherent Slough Tendon Exposed: No Muscle Exposed: No Joint Exposed: No Bone Exposed: No Periwound Skin Texture Texture Color Burda, Socorro M. ([id number]) No Abnormalities Noted: No No Abnormalities Noted: No Callus: No Atrophie Blanche: No Crepitus: No Cyanosis: No Excoriation: Yes Ecchymosis: No Induration: Yes Erythema: No Rash: No Hemosiderin Staining: No Scarring: No Mottled: No Pallor: No Moisture Rubor: No No Abnormalities Noted: No Dry / Scaly: No Temperature / Pain Maceration:  No Temperature: No Abnormality Tenderness on Palpation: Yes Wound Preparation Ulcer Cleansing: Rinsed/Irrigated with Saline Topical Anesthetic Applied: None Electronic Signature(s) Signed: 05/06/2017 4:33:13 PM By: Roger Shelter Entered By: Roger Shelter on 05/06/2017 16:33:13 [facility], [facility] ([id number]) -------------------------------------------------------------------------------- Wound Assessment Details Patient Name: Kirsten Beck. Date of Service: 05/06/2017 12:30 PM Medical Record Number: [id number] Patient Account Number: [id number] Date of Birth/Sex: [birth year] (84 y.o. Female) Treating RN: Roger Shelter Primary Care Crystalle Popwell: Sena Hitch Other Clinician: Referring Ravina Milner: Sena Hitch Treating Malerie Eakins/Extender: Tito Dine in Treatment: 0 Wound Status Wound Number: 2 Primary Pressure Ulcer Etiology: Wound Location: Sacrum Wound Open Wounding Event: Pressure Injury Status: Date Acquired: 04/10/2017 Comorbid Lymphedema, Myocardial Infarction, History of Weeks Of Treatment: 0 History: pressure wounds, Received Chemotherapy, Clustered Wound: No Received Radiation Photos Wound Measurements Length: (cm) 16 % Reduction Width: (cm) 15.5 % Reduction Depth: (cm) 5 Area: (cm) 194.779 Volume: (cm) 973.894 in Area: 0% in Volume: 0% Wound Description Classification: Category/Stage IV Foul Odor A Wound  Margin: Indistinct, nonvisible Due to Prod Exudate Amount: Large Slough/Fibr Exudate Type: Serosanguineous Exudate Color: red, brown fter Cleansing: Yes uct Use: No ino Yes Wound Bed Granulation Amount: Small (1-33%) Exposed Structure Granulation Quality: Red, Friable Fascia Exposed: No Necrotic Amount: Large (67-100%) Fat Layer (Subcutaneous Tissue) Exposed: Yes Necrotic Quality: Adherent Slough Tendon Exposed: No Muscle Exposed: Yes Necrosis of Muscle: No Joint Exposed: No Bone Exposed: No Periwound Skin Texture [facility], [facility] ([id number]) Texture Color No Abnormalities Noted: No No Abnormalities Noted: No Callus: No Atrophie Blanche: No Crepitus: No Cyanosis: No Excoriation: Yes Ecchymosis: No Induration: Yes Erythema: No Rash: No Hemosiderin Staining: No Scarring: No Mottled: No Pallor: No Moisture Rubor: No No Abnormalities Noted: No Dry / Scaly: No Temperature / Pain Maceration: No Temperature: No Abnormality Tenderness on Palpation: Yes Wound Preparation Ulcer Cleansing: Rinsed/Irrigated with Saline Assessment Notes foul dark amber drainage noted dripping from wound on right gluteal area Electronic Signature(s) Signed: 05/06/2017 4:34:15 PM By: Roger Shelter Entered By: Roger Shelter on 05/06/2017 16:34:15 Denise, [facility] ([id number]) -------------------------------------------------------------------------------- Vitals Details Patient Name: Kirsten Beck. Date of Service: 05/06/2017 12:30 PM Medical Record Number: [id number] Patient Account Number: [id number] Date of Birth/Sex: [birth year] (84 y.o. Female) Treating RN: Roger Shelter Primary Care Vallery Mcdade: Sena Hitch Other Clinician: Referring Geary Rufo: Sena Hitch Treating Amalea Ottey/Extender: Tito Dine in Treatment: 0 Vital Signs Time Taken: 12:46 Temperature (F): 99.4 Height (in): 64 Pulse (bpm): 88 Source: Stated Respiratory Rate (breaths/min):  24 Weight (lbs): 160 Blood Pressure (mmHg): 96/57 Source: Stated Reference Range: 80 - 120 mg / dl Body Mass Index (BMI): 27.5 Electronic Signature(s) Signed: 05/06/2017 4:36:13 PM By: Roger Shelter Entered By: Roger Shelter on 05/06/2017 12:49:43

## 2017-06-22 ENCOUNTER — Encounter: Payer: Medicare Other | Attending: Physician Assistant | Admitting: Physician Assistant

## 2017-06-22 DIAGNOSIS — L89154 Pressure ulcer of sacral region, stage 4: Secondary | ICD-10-CM | POA: Diagnosis present

## 2017-06-22 DIAGNOSIS — I4891 Unspecified atrial fibrillation: Secondary | ICD-10-CM | POA: Diagnosis not present

## 2017-06-22 DIAGNOSIS — E43 Unspecified severe protein-calorie malnutrition: Secondary | ICD-10-CM | POA: Diagnosis not present

## 2017-06-22 DIAGNOSIS — L89620 Pressure ulcer of left heel, unstageable: Secondary | ICD-10-CM | POA: Insufficient documentation

## 2017-06-22 DIAGNOSIS — Z853 Personal history of malignant neoplasm of breast: Secondary | ICD-10-CM | POA: Diagnosis not present

## 2017-06-22 DIAGNOSIS — Z923 Personal history of irradiation: Secondary | ICD-10-CM | POA: Insufficient documentation

## 2017-06-22 DIAGNOSIS — Z8249 Family history of ischemic heart disease and other diseases of the circulatory system: Secondary | ICD-10-CM | POA: Diagnosis not present

## 2017-06-22 DIAGNOSIS — I252 Old myocardial infarction: Secondary | ICD-10-CM | POA: Diagnosis not present

## 2017-06-22 DIAGNOSIS — Z87891 Personal history of nicotine dependence: Secondary | ICD-10-CM | POA: Diagnosis not present

## 2017-06-22 DIAGNOSIS — Z8673 Personal history of transient ischemic attack (TIA), and cerebral infarction without residual deficits: Secondary | ICD-10-CM | POA: Insufficient documentation

## 2017-06-22 DIAGNOSIS — L89610 Pressure ulcer of right heel, unstageable: Secondary | ICD-10-CM | POA: Insufficient documentation

## 2017-06-22 DIAGNOSIS — F0390 Unspecified dementia without behavioral disturbance: Secondary | ICD-10-CM | POA: Diagnosis not present

## 2017-06-22 DIAGNOSIS — Z9221 Personal history of antineoplastic chemotherapy: Secondary | ICD-10-CM | POA: Insufficient documentation

## 2017-06-25 NOTE — Progress Notes (Signed)
LILLYN, WIECZOREK ([id number]) Visit Report for 06/22/2017 Chief Complaint Document Details Patient Name: Kirsten Beck, Kirsten Beck. Date of Service: 06/22/2017 12:30 PM Medical Record Number: [id number] Patient Account Number: [id number] Date of Birth/Sex: 31-Jul-1928 (84 y.o. Female) Treating RN: Roger Shelter Primary Care Provider: Sena Hitch Other Clinician: Referring Provider: Marco Collie Treating Provider/Extender: Melburn Hake, Johnita Palleschi Weeks in Treatment: 0 Information Obtained from: Patient Chief Complaint 05/06/17; patient is here for review of an extensive pressure area over the lower sacrum and bilateral buttocks Electronic Signature(s) Signed: 06/23/2017 8:16:06 AM By: Worthy Keeler PA-C Entered By: Worthy Keeler on 06/22/2017 15:50:02 Kirsten Beck ([id number]) -------------------------------------------------------------------------------- HPI Details Patient Name: Kirsten Beck. Date of Service: 06/22/2017 12:30 PM Medical Record Number: [id number] Patient Account Number: [id number] Date of Birth/Sex: 10-17-1928 (84 y.o. 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 Sometime over this timeframe she deteriorated and she developed a pressure ulcer sometime around the Christmas weekend December. This was in her lower coccyx and buttocks area. She was seen in an urgent care on 04/11/17 with rhabdomyolysis decubitus ulcer which was listed as stage III.Marland Kitchen She was back in the ER at the suggestion of home health on 04/23/17 at which time she had an extensive sacral wound  with generalized weakness. At some point during one of these states she had surgical debridement at the bedside. I have not seen him procedure note however. On 04/23/17 notable that her white count was 11.1 hemoglobin 9.3 and an albumin of 2 indicative of probable severe protein calorie malnutrition. She is now at [facility] skilled facility. She has taken's wet to dry to the necrotic areas of her extensive lower sacral and bilateral buttock wounds. She is here for review of this wound 06/22/17 on evaluation today patient is seen for reevaluation inserting the same sacral ulcer which is the main issue for her was previously seen for on 05/06/17. At that point I did review Dr. Janalyn Rouse note which indicated that he felt the best treatment option would be palliative/hospice care. It appeared at that point that the daughters actually were in agreement with plan. With that being said patient is currently sent [place] today for evaluation concerning the fact that they have been told according to what they tell me that their mother needs surgery. They also do not know why they are being told that he is not a candidate to go home for them to care for at this point. Patient's daughter who was present during evaluation today tells me that the plan would be for her to care for her mother at home with the help of hospice. With that being said they have been told that that is not an option due to appointments which would be three times a week by EMS transfer. With that being said I do not think that is correct situation as if she is under hospice care would not necessarily even need follow appear in the clinic and therefore most of her follow-up would actually be performed at home by hospice. Electronic Signature(s) Signed: 06/23/2017 8:16:06 AM By: Worthy Keeler PA-C Entered By: Worthy Keeler on 06/22/2017 15:50:10 Kirsten Beck, Kirsten Sports M.  (  [id number]) -------------------------------------------------------------------------------- Physical Exam Details Patient Name: Kirsten Beck, Kirsten Beck. Date of Service: 06/22/2017 12:30 PM Medical Record Number: [id number] Patient Account Number: [id number] Date of Birth/Sex: [birth year] (84 y.o. Female) Treating RN: Roger Shelter Primary Care Provider: Sena Hitch Other Clinician: Referring Provider: Marco Collie Treating Provider/Extender: STONE III, Shailey Butterbaugh Weeks in Treatment: 0 Constitutional supine blood pressure is within target range for patient.. heart rate irregular.. respirations regular, non-labored and within target range for patient.. Chronically ill appearing but in no apparent acute distress. Eyes conjunctiva clear no eyelid edema noted. pupils equal round and reactive to light and accommodation. Ears, Nose, Mouth, and Throat no gross abnormality of ear auricles or external auditory canals. patient has hearing loss. Respiratory normal breathing without difficulty. clear to auscultation bilaterally. Cardiovascular Irregularly irregular heart rate with evidence of afib. Gastrointestinal (GI) soft, non-tender, non-distended, +BS. no ventral hernia noted. Musculoskeletal Patient unable to walk. Psychiatric Patient is not able to cooperate in decision making regarding care. Patient has dementia. patient is confused. Notes Patient has an extensive wound over the sacral/bilateral gluteal area in one continuous and very deep ulceration. This does undermine significantly at the 10-2 o'clock location in particular. She does have bone exposure and just on palpation this seems to potentially be somewhat necrotic and soft compared to what would be expected. This makes me concerned for the possibility of sacral osteomyelitis although she has not had specific testing for this currently Electronic Signature(s) Signed: 06/23/2017 8:16:06 AM By: Worthy Keeler PA-C Entered By: Worthy Keeler on 06/22/2017 15:52:59 Kirsten Beck ([id number]) -------------------------------------------------------------------------------- Physician Orders Details Patient Name: Kirsten Beck. Date of Service: 06/22/2017 12:30 PM Medical Record Number: [id number] Patient Account Number: [id number] Date of Birth/Sex: 14-Sep-1928 (84 y.o. Female) Treating RN: Ahmed Prima Primary Care Provider: Sena Hitch Other Clinician: Referring Provider: Marco Collie Treating Provider/Extender: STONE III, Maleah Rabago Weeks in Treatment: 0 Verbal / Phone Orders: No Diagnosis Coding Wound Cleansing Wound #3 Right Calcaneus o Clean wound with Normal Saline. Wound #4 Left Calcaneus o Clean wound with Normal Saline. Wound #5 Sacrum o Clean wound with Normal Saline. Primary Wound Dressing Wound #3 Right Calcaneus o Other: - betadine with dry gauze Wound #4 Left Calcaneus o Other: - betadine with dry gauze Wound #5 Sacrum o Other: - dakins soaked gauze packed into sacrum wound Secondary Dressing Wound #3 Right Calcaneus o ABD pad o Other - heel cup and kerlix wrap Wound #4 Left Calcaneus o ABD pad o Other - heel cup and kerlix wrap Wound #5 Sacrum o ABD pad - tape Dressing Change Frequency Wound #3 Right Calcaneus o Change dressing every day. Wound #4 Left Calcaneus o Change dressing every day. Wound #5 Sacrum o Change dressing every day. Follow-up Appointments Wound #3 Right Calcaneus Kirsten Beck, Kirsten Beck ([id number]) o Other: - Daughter will call if follow up needed. Wound #4 Left Calcaneus o Other: - Daughter will call if follow up needed. Wound #5 Sacrum o Other: - Daughter will call if follow up needed. Off-Loading Wound #3 Right Calcaneus o Turn and reposition every 2 hours - cushion booties bilateral for offloading wear daily Wound #4 Left Calcaneus o Turn and reposition every 2 hours - cushion booties bilateral for offloading wear  daily Wound #5 Sacrum o Turn and reposition every 2 hours - cushion booties bilateral for offloading wear daily Electronic Signature(s) Signed: 06/23/2017 8:16:06 AM By: Worthy Keeler PA-C Signed: 06/24/2017 8:38:15 AM By: Alric Quan Entered By: Alric Quan on 06/22/2017 13:43:20 Kirsten Beck, Kirsten Sports  Jerilynn Beck ([id number]) -------------------------------------------------------------------------------- Problem List Details Patient Name: Kirsten Beck, Kirsten Beck. Date of Service: 06/22/2017 12:30 PM Medical Record Number: [id number] Patient Account Number: [id number] Date of Birth/Sex: 1928-08-10 (84 y.o. Female) Treating RN: Roger Shelter Primary Care Provider: Sena Hitch Other Clinician: Referring Provider: Marco Collie Treating Provider/Extender: Melburn Hake, Joshiah Traynham Weeks in Treatment: 0 Active Problems ICD-10 Encounter Code Description Active Date Diagnosis L89.154 Pressure ulcer of sacral region, stage 4 06/22/2017 Yes L89.620 Pressure ulcer of left heel, unstageable 06/22/2017 Yes L89.610 Pressure ulcer of right heel, unstageable 06/22/2017 Yes E43 Unspecified severe protein-calorie malnutrition 06/22/2017 Yes Inactive Problems Resolved Problems Electronic Signature(s) Signed: 06/23/2017 8:16:06 AM By: Worthy Keeler PA-C Entered By: Worthy Keeler on 06/22/2017 15:49:56 Cavey, [facility] ([id number]) -------------------------------------------------------------------------------- Progress Note Details Patient Name: Kirsten Beck. Date of Service: 06/22/2017 12:30 PM Medical Record Number: [id number] Patient Account Number: [id number] Date of Birth/Sex: 29-Aug-1928 (84 y.o. 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 Sometime over this timeframe she deteriorated and she developed a pressure ulcer sometime around the Christmas weekend December. This was in her lower coccyx and buttocks area. She was seen in an urgent care on 04/11/17 with rhabdomyolysis decubitus ulcer which was listed as stage III.Marland Kitchen She was back in the ER at the suggestion of home health on 04/23/17 at which time she had an extensive sacral wound with generalized weakness. At some point during one of these states she had surgical debridement at the bedside. I have not seen him procedure note however. On 04/23/17 notable that her white count was 11.1 hemoglobin 9.3 and an albumin of 2 indicative of probable severe protein calorie malnutrition. She is now at [facility] skilled facility. She has taken's wet to dry to the necrotic areas of her extensive lower sacral and bilateral buttock wounds. She is here for review of this wound 06/22/17 on evaluation today patient is seen for reevaluation inserting the same sacral ulcer which is the main issue for her was previously seen for on 05/06/17. At that point I did review Dr. Janalyn Rouse note which indicated that he felt the best treatment option would be palliative/hospice care. It appeared at that point that the daughters actually were in agreement with plan. With that being said patient is currently sent [place] today for evaluation concerning the fact that they have been told according to what they tell me that their mother needs surgery. They also do not know why they are being told that he is not a candidate to go home for them to care for at this point. Patient's daughter who was  present during evaluation today tells me that the plan would be for her to care for her mother at home with the help of hospice. With that being said they have been told that that is not an option due to appointments which would be three times a week by EMS transfer. With that being said I do not think that is correct situation as if she is under hospice care would not necessarily  even need follow appear in the clinic and therefore most of her follow-up would actually be performed at home by hospice. Wound History Patient presents with 3 open wounds. Laboratory tests have been performed in the last month. Patient reportedly has tested positive for an antibiotic resistant organism. Patient History Unable to Obtain Patient History due to Aphasia. Information obtained from Patient. Allergies peanut butter Family History Heart Disease - Mother, No family history of Cancer, Diabetes, Hereditary Spherocytosis, Hypertension, Kidney Disease, Lung Disease, Seizures, Stroke, Thyroid Problems, Tuberculosis. Kirsten Beck, Kirsten Beck ([id number]) Social History Former smoker, Marital Status - Widowed, Alcohol Use - Never, Drug Use - No History, Caffeine Use - Never. 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 heart rate irregular.. respirations regular, non-labored and within target range for patient.. Chronically ill appearing but in no apparent acute distress. Vitals Time Taken: 12:40 PM, Temperature: 97.8 F, Pulse: 87 bpm, Respiratory Rate: 20 breaths/min, Blood Pressure: 115/80 mmHg. Eyes conjunctiva clear no eyelid edema noted. pupils equal round and reactive to light and accommodation. Ears, Nose, Mouth, and Throat no gross abnormality of ear auricles or external  auditory canals. patient has hearing loss. Respiratory normal breathing without difficulty. clear to auscultation bilaterally. Cardiovascular Irregularly irregular heart rate with evidence of afib. Gastrointestinal (GI) soft, non-tender, non-distended, +BS. no ventral hernia noted. Musculoskeletal Patient unable to walk. Psychiatric Patient is not able to cooperate in decision making regarding care. Patient has dementia. patient is confused. General Notes: Patient has an extensive wound over the sacral/bilateral gluteal area in one continuous and very deep ulceration. This does undermine significantly at the 10-2 o'clock location in particular. She does have bone exposure and just on palpation this seems to potentially be somewhat necrotic and soft compared to what would be expected. This makes me concerned for the possibility of sacral osteomyelitis although she has not had specific testing for this currently Kirsten Beck, Kirsten Beck. ([id number]) Integumentary (Hair, Skin) Wound #3 status is Open. Original cause of wound was Pressure Injury. The wound is located on the Right Calcaneus. The wound measures 3cm length x 3cm width x 0.1cm depth; 7.069cm^2 area and 0.707cm^3 volume. There is no tunneling or undermining noted. There is a none present amount of drainage noted. The wound margin is flat and intact. There is no granulation within the wound bed. There is a large (67-100%) amount of necrotic tissue within the wound bed including Eschar. The periwound skin appearance exhibited: Callus. The periwound skin appearance did not exhibit: Crepitus, Excoriation, Induration, Rash, Scarring, Dry/Scaly, Maceration, Atrophie Blanche, Cyanosis, Ecchymosis, Hemosiderin Staining, Mottled, Pallor, Rubor, Erythema. Wound #4 status is Open. Original cause of wound was Pressure Injury. The wound is located on the Left Calcaneus. The wound measures 1cm length x 1.6cm width x 0.1cm depth; 1.257cm^2 area and  0.126cm^3 volume. There is no tunneling or undermining noted. There is a none present amount of drainage noted. The wound margin is distinct with the outline attached to the wound base. There is no granulation within the wound bed. There is a large (67-100%) amount of necrotic tissue within the wound bed including Eschar. The periwound skin appearance did not exhibit: Callus, Crepitus, Excoriation, Induration, Rash, Scarring, Dry/Scaly, Maceration, Atrophie Blanche, Cyanosis, Ecchymosis, Hemosiderin Staining, Mottled, Pallor, Rubor, Erythema. Periwound temperature was noted as No Abnormality. The periwound has tenderness on palpation. Wound #5  status is Open. Original cause of wound was Pressure Injury. The wound is located on the Sacrum. The wound measures 15.7cm length x 14cm width x 6.5cm depth; 172.631cm^2 area and 1122.098cm^3 volume. There is bone, muscle, Fat Layer (Subcutaneous Tissue) Exposed, and fascia exposed. There is no tunneling noted, however, there is undermining starting at 9:00 and ending at 4:00 with a maximum distance of 9cm. There is a large amount of serous drainage noted. There is large (67-100%) red granulation within the wound bed. There is a small (1-33%) amount of necrotic tissue within the wound bed including Adherent Slough. The periwound skin appearance did not exhibit: Callus, Crepitus, Excoriation, Induration, Rash, Scarring, Dry/Scaly, Maceration, Atrophie Blanche, Cyanosis, Ecchymosis, Hemosiderin Staining, Mottled, Pallor, Rubor, Erythema. The periwound has tenderness on palpation. Assessment Active Problems ICD-10 L89.154 - Pressure ulcer of sacral region, stage 4 L89.620 - Pressure ulcer of left heel, unstageable L89.610 - Pressure ulcer of right heel, unstageable E43 - Unspecified severe protein-calorie malnutrition Procedures Wound(s) were mechanically debrided using saline and gauze to remove devitalized tissue Plan Wound Cleansing: Wound #3 Right  Calcaneus: Clean wound with Normal Saline. Wound #4 Left Calcaneus: Clean wound with Normal Saline. Kirsten Beck, Kirsten Beck. ([id number]) Wound #5 Sacrum: Clean wound with Normal Saline. Primary Wound Dressing: Wound #3 Right Calcaneus: Other: - betadine with dry gauze Wound #4 Left Calcaneus: Other: - betadine with dry gauze Wound #5 Sacrum: Other: - dakins soaked gauze packed into sacrum wound Secondary Dressing: Wound #3 Right Calcaneus: ABD pad Other - heel cup and kerlix wrap Wound #4 Left Calcaneus: ABD pad Other - heel cup and kerlix wrap Wound #5 Sacrum: ABD pad - tape Dressing Change Frequency: Wound #3 Right Calcaneus: Change dressing every day. Wound #4 Left Calcaneus: Change dressing every day. Wound #5 Sacrum: Change dressing every day. Follow-up Appointments: Wound #3 Right Calcaneus: Other: - Daughter will call if follow up needed. Wound #4 Left Calcaneus: Other: - Daughter will call if follow up needed. Wound #5 Sacrum: Other: - Daughter will call if follow up needed. Off-Loading: Wound #3 Right Calcaneus: Turn and reposition every 2 hours - cushion booties bilateral for offloading wear daily Wound #4 Left Calcaneus: Turn and reposition every 2 hours - cushion booties bilateral for offloading wear daily Wound #5 Sacrum: Turn and reposition every 2 hours - cushion booties bilateral for offloading wear daily Currently I explained to patient's daughter both present as well as her other daughter Neoma Laming whom I called that I think the best course of treatment at this point in time would be hospice care. I do not see any reason this cannot be done in home as long as they have a full-time caregiver can care for her. With that being said this does need to be someone who would be there at all times in order to accommodate for any issues that may arise. Obviously getting hospice involved will be helpful with end-of-life care to the significant sacral wound that I do not  believe is likely to ever heel or least not in a very short amount of time by any mean. We likely our talking years and that is the absolute best case scenario. For that reason I explained that I do believe hospice care would be the best scenario in both of the patient's daughters whom I spoke with today seem to be in agreement with this. I wrote orders to this effect as well. I do not feel that she needs any further appointments here at the wound care  center as I feel that the dressing changes that are going to be the standard of care for some time going forward with the Dakin s soaked gauze packing can be done in the facility or at home by her daughter if trained appropriately. Patient's daughters are in agreement with the plan. We will therefore discontinue wound care services and allow the facility to continue with care for the time being until such a time that Kirsten Beck, Kirsten Beck. ([id number]) hospice becomes involved in patient is transferred home or if the decision is made just to keep her in the facility either way I think they can manage this without link the somewhat painful transport for the patient to come out to the wound care center. Electronic Signature(s) Signed: 06/23/2017 8:16:06 AM By: Worthy Keeler PA-C Entered By: Worthy Keeler on 06/22/2017 15:56:30 Kirsten Beck ([id number]) -------------------------------------------------------------------------------- ROS/PFSH Details Patient Name: Kirsten Beck. Date of Service: 06/22/2017 12:30 PM Medical Record Number: [id number] Patient Account Number: [id number] Date of Birth/Sex: 19-Nov-1928 (84 y.o. Female) Treating RN: Ahmed Prima Primary Care Provider: Sena Hitch Other Clinician: Referring Provider: Marco Collie Treating Provider/Extender: Melburn Hake, Derrick Orris Weeks in Treatment: 0 Unable to Obtain Patient History due to oo Aphasia Information Obtained From Patient Wound History Do you currently have one or  more open woundso Yes How many open wounds do you currently haveo 3 Eyes Medical History: Negative for: Cataracts; Glaucoma; Optic Neuritis Hematologic/Lymphatic Medical History: Positive for: Lymphedema Negative for: Anemia; Hemophilia; Human Immunodeficiency Virus; Sickle Cell Disease Respiratory Medical History: Negative for: Aspiration; Asthma; Chronic Obstructive Pulmonary Disease (COPD); Pneumothorax; Sleep Apnea; Tuberculosis Cardiovascular Complaints and Symptoms: Review of System Notes: stroke Medical History: Positive for: Myocardial Infarction Negative for: Arrhythmia; Congestive Heart Failure; Coronary Artery Disease; Deep Vein Thrombosis; Hypertension; Hypotension; Peripheral Arterial Disease; Peripheral Venous Disease; Phlebitis; Vasculitis Gastrointestinal Medical History: Negative for: Cirrhosis ; Colitis; Crohnos; Hepatitis A; Hepatitis B; Hepatitis C Endocrine Medical History: Negative for: Type I Diabetes; Type II Diabetes Genitourinary SHIMA, COMPERE ([id number]) Medical History: Negative for: End Stage Renal Disease Immunological Medical History: Negative for: Lupus Erythematosus; Raynaudos; Scleroderma Integumentary (Skin) Medical History: Positive for: History of pressure wounds Negative for: History of Burn Musculoskeletal Medical History: Negative for: Gout; Rheumatoid Arthritis; Osteoarthritis; Osteomyelitis Neurologic Medical History: Negative for: Neuropathy Past Medical History Notes: 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 Oncologic Complaints and Symptoms: Review of System Notes: hx breast ca Medical History: Positive for: Received Chemotherapy; Received Radiation Past Medical History Notes: breast cancer Psychiatric Medical History: Negative for: Anorexia/bulimia; Confinement Anxiety Immunizations Pneumococcal Vaccine: Received Pneumococcal Vaccination: Yes Implantable Devices Family and Social History Cancer: No;  Diabetes: No; Heart Disease: Yes - Mother; Hereditary Spherocytosis: No; Hypertension: No; Kidney Disease: No; Lung Disease: No; Seizures: No; Stroke: No; Thyroid Problems: No; Tuberculosis: No; Former smoker; Marital Status - Widowed; Alcohol Use: Never; Drug Use: No History; Caffeine Use: Never; Financial Concerns: No; Food, Clothing or Shelter Needs: No; Support System Lacking: No; Transportation Concerns: No; Advanced Directives: No; Patient does not want information on Advanced Directives; Do not resuscitate: No; Living Will: No; Medical Power of Attorney: No Electronic Signature(s) Signed: 06/23/2017 8:16:06 AM By: Worthy Keeler PA-C Signed: 06/24/2017 8:38:15 AM By: Jola Schmidt M. ([id number]) Entered By: Alric Quan on 06/22/2017 13:08:27 Kirsten Beck ([id number]) -------------------------------------------------------------------------------- SuperBill Details Patient Name: Kirsten Beck. Date of Service: 06/22/2017 Medical Record Number: [id number] Patient Account Number: [id number] Date of Birth/Sex: 28-Nov-1928 (84 y.o. Female) Treating RN: Carolyne Fiscal, Debi  Primary Care Provider: Sena Hitch Other Clinician: Referring Provider: Marco Collie Treating Provider/Extender: Melburn Hake, Imara Standiford Weeks in Treatment: 0 Diagnosis Coding ICD-10 Codes Code Description L89.154 Pressure ulcer of sacral region, stage 4 L89.620 Pressure ulcer of left heel, unstageable L89.610 Pressure ulcer of right heel, unstageable E43 Unspecified severe protein-calorie malnutrition Facility Procedures CPT4 Code: 91505697 Description: 94801 - WOUND CARE VISIT-LEV 4 EST PT Modifier: Quantity: 1 Physician Procedures CPT4 Code: 6553748 Description: 27078 - WC PHYS LEVEL 4 - EST PT ICD-10 Diagnosis Description L89.154 Pressure ulcer of sacral region, stage 4 L89.620 Pressure ulcer of left heel, unstageable L89.610 Pressure ulcer of right heel, unstageable E43 Unspecified  severe  protein-calorie malnutrition Modifier: Quantity: 1 Electronic Signature(s) Signed: 06/23/2017 8:16:06 AM By: Worthy Keeler PA-C Entered By: Worthy Keeler on 06/22/2017 15:57:01

## 2017-06-25 NOTE — Progress Notes (Signed)
KEYMORA, GRILLOT ([id number]) Visit Report for 06/22/2017 Abuse/Suicide Risk Screen Details Patient Name: Kirsten Beck, Kirsten Beck. Date of Service: 06/22/2017 12:30 PM Medical Record Number: [id number] Patient Account Number: [id number] Date of Birth/Sex: [birth year] (84 y.o. Female) Treating RN: Ahmed Prima Primary Care Dragon Thrush: Sena Hitch Other Clinician: Referring Guliana Weyandt: Marco Collie Treating Brody Bonneau/Extender: Melburn Hake, HOYT Weeks in Treatment: 0 Abuse/Suicide Risk Screen Items Answer Notes pt unable to answer Electronic Signature(s) Signed: 06/24/2017 8:38:15 AM By: Alric Quan Entered By: Alric Quan on 06/22/2017 13:08:40 Kirsten Beck ([id number]) -------------------------------------------------------------------------------- Activities of Daily Living Details Patient Name: Kirsten Beck. Date of Service: 06/22/2017 12:30 PM Medical Record Number: [id number] Patient Account Number: [id number] Date of Birth/Sex: [birth year] (84 y.o. Female) Treating RN: Ahmed Prima Primary Care Loy Little: Sena Hitch Other Clinician: Referring Carsen Leaf: Marco Collie Treating Siri Buege/Extender: Melburn Hake, HOYT Weeks in Treatment: 0 Activities of Daily Living Items Answer Activities of Daily Living (Please select one for each item) Drive Automobile Not Able Take Medications Not Able Use Telephone Not Able Care for Appearance Not Able Use Toilet Not Able Manus Rudd / Shower Not Able Dress Self Not Able Feed Self Not Able Walk Not Able Get In / Out Bed Not Able Housework Not Able Prepare Meals Not Able Handle Money Not Able Shop for Self Not Able Electronic Signature(s) Signed: 06/24/2017 8:38:15 AM By: Alric Quan Entered By: Alric Quan on 06/22/2017 13:09:03 Kirsten Beck ([id number]) -------------------------------------------------------------------------------- Education Assessment Details Patient Name: Kirsten Beck. Date of Service:  06/22/2017 12:30 PM Medical Record Number: [id number] Patient Account Number: [id number] Date of Birth/Sex: 1928/05/13 (84 y.o. Female) Treating RN: Ahmed Prima Primary Care Peri Kreft: Sena Hitch Other Clinician: Referring Ludie Pavlik: Marco Collie Treating Jayleigh Notarianni/Extender: Melburn Hake, HOYT Weeks in Treatment: 0 Primary Learner Assessed: Caregiver daughter Learning Preferences/Education Level/Primary Language Learning Preference: Explanation, Printed Material Highest Education Level: High School Preferred Language: English Cognitive Barrier Assessment/Beliefs Language Barrier: No Translator Needed: No Memory Deficit: No Emotional Barrier: No Cultural/Religious Beliefs Affecting Medical Care: No Physical Barrier Assessment Impaired Vision: No Impaired Hearing: No Decreased Hand dexterity: No Knowledge/Comprehension Assessment Knowledge Level: Medium Comprehension Level: Medium Ability to understand written Medium instructions: Ability to understand verbal Medium instructions: Motivation Assessment Anxiety Level: Calm Cooperation: Cooperative Education Importance: Acknowledges Need Interest in Health Problems: Asks Questions Perception: Coherent Willingness to Engage in Self- Medium Management Activities: Readiness to Engage in Self- Medium Management Activities: Electronic Signature(s) Signed: 06/24/2017 8:38:15 AM By: Alric Quan Entered By: Alric Quan on 06/22/2017 13:09:34 Kirsten Beck ([id number]) -------------------------------------------------------------------------------- Fall Risk Assessment Details Patient Name: Kirsten Beck. Date of Service: 06/22/2017 12:30 PM Medical Record Number: [id number] Patient Account Number: [id number] Date of Birth/Sex: 07/20/1928 (84 y.o. Female) Treating RN: Carolyne Fiscal, Debi Primary Care Zowie Lundahl: Sena Hitch Other Clinician: Referring Kaianna Dolezal: Marco Collie Treating Chayce Rullo/Extender: Melburn Hake, HOYT Weeks in Treatment: 0 Fall Risk Assessment Items Have you had 2 or more falls in the last 12 monthso 0 No Have you had any fall that resulted in injury in the last 12 monthso 0 No FALL RISK ASSESSMENT: History of falling - immediate or within 3 months 0 No Secondary diagnosis 15 Yes Ambulatory aid None/bed rest/wheelchair/nurse 0 No Crutches/cane/walker 0 No Furniture 0 No IV Access/Saline Lock 0 No Gait/Training Normal/bed rest/immobile 0 Yes Weak 0 No Impaired 20 Yes Mental Status Oriented to own ability 0 No Electronic Signature(s) Signed: 06/24/2017 8:38:15 AM By: Alric Quan Entered By: Alric Quan on 06/22/2017 13:10:05 Kirsten Beck ([id number]) -------------------------------------------------------------------------------- Foot Assessment Details Patient Name:  [name], [name] Date of Service: 06/22/2017 12:30 PM Medical Record Number: [id number] Patient Account Number: [id number] Date of Birth/Sex: [birth year] (84 y.o. Female) Treating RN: Carolyne Fiscal, Debi Primary Care Meztli Llanas: Sena Hitch Other Clinician: Referring Brody Kump: Marco Collie Treating Chyan Carnero/Extender: Melburn Hake, HOYT Weeks in Treatment: 0 Foot Assessment Items Site Locations + = Sensation present, - = Sensation absent, C = Callus, U = Ulcer R = Redness, W = Warmth, M = Maceration, PU = Pre-ulcerative lesion F = Fissure, S = Swelling, D = Dryness Assessment Right: Left: Other Deformity: No No Prior Foot Ulcer: No No Prior Amputation: No No Charcot Joint: No No Ambulatory Status: Gait: Notes pt unable to answer Electronic Signature(s) Signed: 06/24/2017 8:38:15 AM By: Alric Quan Entered By: Alric Quan on 06/22/2017 13:10:45 Kirsten Beck ([id number]) -------------------------------------------------------------------------------- Nutrition Risk Assessment Details Patient Name: Kirsten Beck. Date of Service: 06/22/2017 12:30 PM Medical Record  Number: [id number] Patient Account Number: [id number] Date of Birth/Sex: 10/11/1928 (84 y.o. Female) Treating RN: Ahmed Prima Primary Care Sharmel Ballantine: Sena Hitch Other Clinician: Referring Bern Fare: Marco Collie Treating Cydney Alvarenga/Extender: STONE III, HOYT Weeks in Treatment: 0 Height (in): Weight (lbs): Body Mass Index (BMI): Nutrition Risk Assessment Items NUTRITION RISK SCREEN: I have an illness or condition that made me change the kind and/or amount of 2 Yes food I eat I eat fewer than two meals per day 3 Yes I eat few fruits and vegetables, or milk products 0 No I have three or more drinks of beer, liquor or wine almost every day 0 No I have tooth or mouth problems that make it hard for me to eat 0 No I don't always have enough money to buy the food I need 0 No I eat alone most of the time 0 No I take three or more different prescribed or over-the-counter drugs a day 1 Yes Without wanting to, I have lost or gained 10 pounds in the last six months 0 No I am not always physically able to shop, cook and/or feed myself 0 No Nutrition Protocols Good Risk Protocol Moderate Risk Protocol Electronic Signature(s) Signed: 06/24/2017 8:38:15 AM By: Alric Quan Entered By: Alric Quan on 06/22/2017 13:10:30

## 2017-08-12 DEATH — deceased

## 2019-02-09 IMAGING — CR DG CHEST 1V PORT
1 series · 1 of 1 positions shown · non-contrast
Comparison: 10/06/2016 chest radiograph.

CLINICAL DATA: Decubitus ulcers, breast cancer

EXAM:
PORTABLE CHEST 1 VIEW

[dg chest port 1 view]
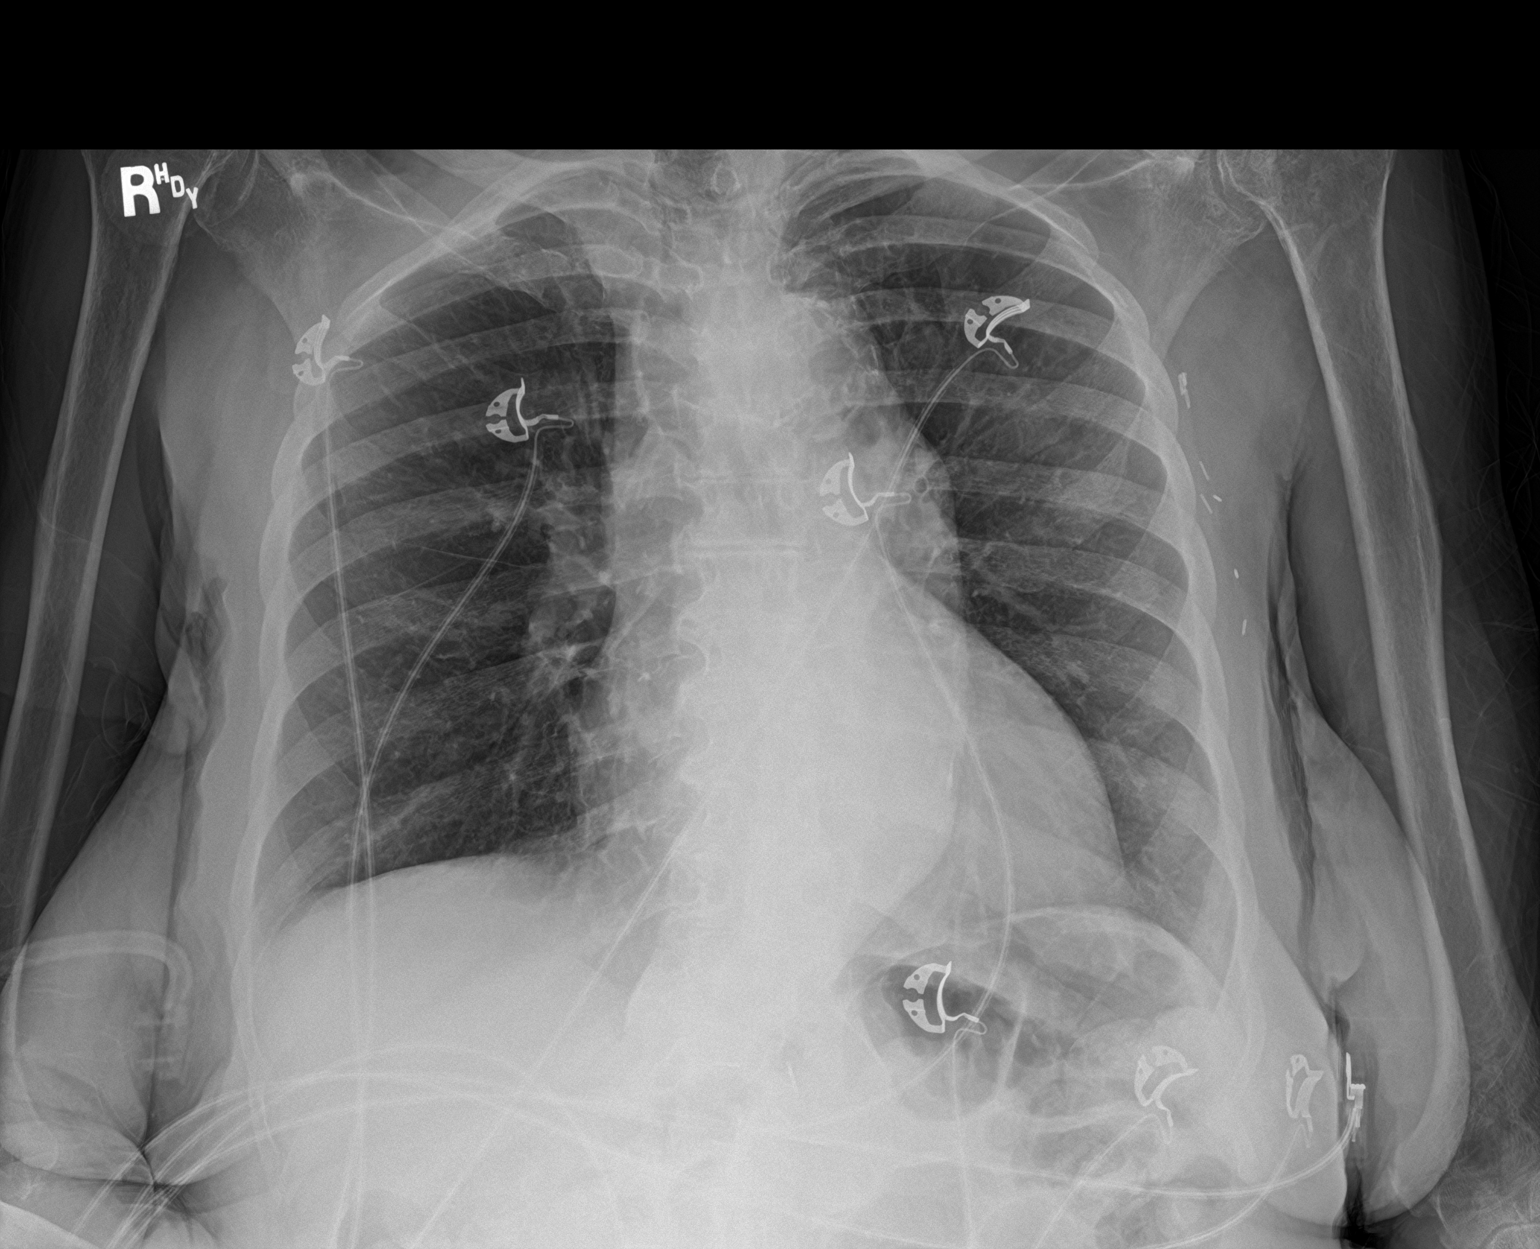

[1 of 1 positions shown; findings below may reference images not displayed]

FINDINGS: Surgical clips are again noted in the left axilla. Stable
cardiomediastinal silhouette with top-normal heart size and tortuous
atherosclerotic thoracic aorta. No pneumothorax. No pleural
effusion. Lungs appear clear, with no acute consolidative airspace
disease and no pulmonary edema.
IMPRESSION: No active disease.
